# Patient Record
Sex: Male | Born: 1935 | Race: White | Hispanic: No | Smoking: Former smoker
Health system: Southern US, Community
[De-identification: ages and names within clinical notes are randomized; demographics above are authoritative.]

## PROBLEM LIST (undated history)

## (undated) DIAGNOSIS — I10 Essential (primary) hypertension: Secondary | ICD-10-CM

## (undated) DIAGNOSIS — H9190 Unspecified hearing loss, unspecified ear: Secondary | ICD-10-CM

## (undated) DIAGNOSIS — C801 Malignant (primary) neoplasm, unspecified: Secondary | ICD-10-CM

## (undated) DIAGNOSIS — E7800 Pure hypercholesterolemia, unspecified: Secondary | ICD-10-CM

## (undated) HISTORY — PX: COLON RESECTION: SHX5231

## (undated) HISTORY — PX: LUNG SURGERY: SHX703

## (undated) HISTORY — PX: RADIOACTIVE SEED IMPLANT: SHX5150

---

## 1999-09-19 ENCOUNTER — Ambulatory Visit (HOSPITAL_COMMUNITY): Admission: RE | Admit: 1999-09-19 | Discharge: 1999-09-19 | Payer: Self-pay | Admitting: *Deleted

## 1999-12-06 ENCOUNTER — Ambulatory Visit (HOSPITAL_COMMUNITY): Admission: RE | Admit: 1999-12-06 | Discharge: 1999-12-06 | Payer: Self-pay | Admitting: Gastroenterology

## 1999-12-06 ENCOUNTER — Encounter (INDEPENDENT_AMBULATORY_CARE_PROVIDER_SITE_OTHER): Payer: Self-pay | Admitting: Specialist

## 2000-11-27 ENCOUNTER — Encounter (INDEPENDENT_AMBULATORY_CARE_PROVIDER_SITE_OTHER): Payer: Self-pay | Admitting: Specialist

## 2000-11-27 ENCOUNTER — Other Ambulatory Visit: Admission: RE | Admit: 2000-11-27 | Discharge: 2000-11-27 | Payer: Self-pay | Admitting: Urology

## 2000-12-19 ENCOUNTER — Ambulatory Visit: Admission: RE | Admit: 2000-12-19 | Discharge: 2001-03-19 | Payer: Self-pay | Admitting: Radiation Oncology

## 2001-02-23 ENCOUNTER — Ambulatory Visit (HOSPITAL_COMMUNITY): Admission: RE | Admit: 2001-02-23 | Discharge: 2001-02-23 | Payer: Self-pay | Admitting: Urology

## 2001-02-23 ENCOUNTER — Encounter: Payer: Self-pay | Admitting: Urology

## 2001-03-23 ENCOUNTER — Ambulatory Visit (HOSPITAL_BASED_OUTPATIENT_CLINIC_OR_DEPARTMENT_OTHER): Admission: RE | Admit: 2001-03-23 | Discharge: 2001-03-23 | Payer: Self-pay | Admitting: Urology

## 2001-04-13 ENCOUNTER — Ambulatory Visit: Admission: RE | Admit: 2001-04-13 | Discharge: 2001-07-12 | Payer: Self-pay | Admitting: Radiation Oncology

## 2002-12-02 ENCOUNTER — Encounter (INDEPENDENT_AMBULATORY_CARE_PROVIDER_SITE_OTHER): Payer: Self-pay | Admitting: Specialist

## 2002-12-02 ENCOUNTER — Ambulatory Visit (HOSPITAL_COMMUNITY): Admission: RE | Admit: 2002-12-02 | Discharge: 2002-12-02 | Payer: Self-pay | Admitting: Gastroenterology

## 2006-11-12 ENCOUNTER — Encounter (INDEPENDENT_AMBULATORY_CARE_PROVIDER_SITE_OTHER): Payer: Self-pay | Admitting: Internal Medicine

## 2006-11-12 ENCOUNTER — Ambulatory Visit: Payer: Self-pay | Admitting: Vascular Surgery

## 2006-11-12 ENCOUNTER — Ambulatory Visit (HOSPITAL_COMMUNITY): Admission: RE | Admit: 2006-11-12 | Discharge: 2006-11-12 | Payer: Self-pay | Admitting: Internal Medicine

## 2009-10-12 ENCOUNTER — Ambulatory Visit (HOSPITAL_COMMUNITY): Admission: RE | Admit: 2009-10-12 | Discharge: 2009-10-13 | Payer: Self-pay | Admitting: General Surgery

## 2010-07-16 LAB — DIFFERENTIAL
Basophils Absolute: 0.1 10*3/uL (ref 0.0–0.1)
Basophils Relative: 1 % (ref 0–1)
Eosinophils Absolute: 0.2 10*3/uL (ref 0.0–0.7)
Eosinophils Relative: 3 % (ref 0–5)
Lymphocytes Relative: 23 % (ref 12–46)
Lymphs Abs: 1.3 10*3/uL (ref 0.7–4.0)
Monocytes Absolute: 0.4 10*3/uL (ref 0.1–1.0)
Monocytes Relative: 6 % (ref 3–12)
Neutro Abs: 3.6 10*3/uL (ref 1.7–7.7)
Neutrophils Relative %: 67 % (ref 43–77)

## 2010-07-16 LAB — CBC
HCT: 42.4 % (ref 39.0–52.0)
Hemoglobin: 14.7 g/dL (ref 13.0–17.0)
MCHC: 34.6 g/dL (ref 30.0–36.0)
MCV: 99.4 fL (ref 78.0–100.0)
Platelets: 121 10*3/uL — ABNORMAL LOW (ref 150–400)
RBC: 4.27 MIL/uL (ref 4.22–5.81)
RDW: 13 % (ref 11.5–15.5)
WBC: 5.6 10*3/uL (ref 4.0–10.5)

## 2010-07-16 LAB — BASIC METABOLIC PANEL
BUN: 6 mg/dL (ref 6–23)
CO2: 28 mEq/L (ref 19–32)
Calcium: 8.6 mg/dL (ref 8.4–10.5)
Chloride: 106 mEq/L (ref 96–112)
Creatinine, Ser: 1.18 mg/dL (ref 0.4–1.5)
GFR calc Af Amer: >60 mL/min (ref >60)
GFR calc non Af Amer: 59 mL/min — ABNORMAL LOW (ref >60)
Glucose, Bld: 109 mg/dL — ABNORMAL HIGH (ref 70–99)
Potassium: 3.3 mEq/L — ABNORMAL LOW (ref 3.5–5.1)
Sodium: 140 mEq/L (ref 135–145)

## 2010-07-16 LAB — SURGICAL PCR SCREEN
MRSA, PCR: NEGATIVE
Staphylococcus aureus: POSITIVE — AB

## 2010-09-14 NOTE — Op Note (Signed)
[facility]  Patient:    Scott Orozco, Scott Orozco Visit Number: [id number] MRN: [id number]          Service Type: NES Location: [facility] Attending Physician:  Thermon Leyland Dictated by:   Barron Alvine, M.D. Admit Date:  03/23/2001   CC:         Maryln Gottron, M.D.  Tyson Dense, M.D.   Operative Report  PREOPERATIVE DIAGNOSIS:  Clinical stage T1C adenocarcinoma of the prostate.  POSTOPERATIVE DIAGNOSIS:  Clinical stage T1C adenocarcinoma of the prostate.  PROCEDURE PERFORMED:  Interstitial 125 seed implantation and flexible cystoscopy.  SURGEON:  Barron Alvine, M.D.  ASSISTANT:  Maryln Gottron, M.D.  ANESTHESIA:  General.  INDICATIONS:  The patient is a 77 year old male.  He has been diagnosed with clinical stage T1C adenocarcinoma of the prostate.  He had a small prostate nodule in the area of induration, but he had a normal PSA of 3.6.  Biopsies revealed a Gleason 4+3=7 adenocarcinoma of the prostate.  We did not feel that the patient was a good candidate for surgery.  We did feel that given the higher Gleason score, that this would be potentially a more aggressive carcinoma.  For that reason, we felt treatment was reasonable versus observation.  Given the higher Gleason score, we also felt that he would be best served with a combination of external beam and interstitial seed implantation rather than one treatment by itself.  The advantage and disadvantage of this as well as all potential complications of radiation and seed implantation were discussed at some length.  The patient has received his external beam radiation and has had his planning ultrasonography for seed implantation.  He presents now for the procedure.  TECHNIQUE AND FINDINGS:  The patient was brought to the operating room where he had the successful induction of general anesthesia.  He was placed in the moderate lithotomy position, and prepped and draped in the usual  manner.  A Foley catheter was placed with contrast in the balloon to identify the bladder neck.  Positioning was done very carefully to assure similar positioning to his planning ultrasound.  The transrectal ultrasound probe was attached to a base which was firmly secured to the fluoroscopy table.  After careful positioning, we began the implantation after prepping and draping him in the usual manner.  Utilizing a combination of Real Time fluoroscopy as well as Real Time ultrasonography, we went ahead and initiated seed implantation.  Anchor needles were also placed in the prostate for increased stability.  A total of 19 needle passages were performed with a total of 57 seeds.  Of note, the prostate volume was only about 15 g.  Each needle passage was done with Real Time fluoroscopy as well as ultrasound guidance.  The distribution of the seeds at the end of the procedure looked excellent.  The Foley catheter was removed and flexible cystoscopy revealed no seeds within the urethra or bladder.  A repeat Foley catheter was placed in the typical manner.  The patient was then brought to the recovery room in stable condition. Dictated by:   Barron Alvine, M.D. Attending Physician:  Thermon Leyland DD:  03/23/01 TD:  03/23/01 Job: [id number]

## 2010-09-14 NOTE — Procedures (Signed)
[facility]  Patient:    Scott Orozco, Scott Orozco                      MRN: [id number] Proc. Date: 12/06/99 Attending:  Verlin Grills, M.D. CC:         Lum Babe, M.D.   Procedure Report  REFERRING PHYSICIAN:  Lum Babe, M.D.  PROCEDURE PERFORMED:  Colonoscopy and polypectomy.  ENDOSCOPIST:  Verlin Grills, M.D.  INDICATIONS:  Mr. Scott Orozco is a 77 year old male who underwent colon cancer surgery in approximately 1983.  He is due for post colon cancer polyp surveillance.  I discussed with Mr. Scott Orozco the complications associated with colonoscopy and polypectomy including intestinal bleeding and intestinal perforation. Mr. Scott Orozco has signed the operative permit.  PREMEDICATIONS:  Demerol 50 mg and Versed 5 mg.  ENDOSCOPE:  Olympus pediatric colonoscope.  DESCRIPTION OF PROCEDURE:  After obtaining informed consent, the patient was placed in the left lateral decubitus position.  I administered intravenous Demerol and intravenous Versed to achieve conscious sedation for the procedure.  The patients blood pressure, oxygen saturation and cardiac rhythm were monitored throughout the procedure and documented in the medical record.  Anal inspection was normal.  Digital rectal exam revealed an absent prostate. The Olympus pediatric video colonoscope was introduced into the rectum and under direct vision advanced to the cecum identified by a normal appearing ileocecal valve.  Colonic preparation for the exam today was excellent.  FINDINGS: 1. Rectum normal. 2. Sigmoid colon and descending colon:  At 40 cm from the anal verge,    a 1 mm sessile polyp was removed with hot biopsy forceps and    submitted for pathological interpretation. 3. Splenic flexure normal. 4. Transverse colon normal. 5. Hepatic flexure. 6. Ascending colon:  From the ascending colon, two 0.5 mm sessile    polyps were removed with cold biopsy forceps. 7.  Cecum and ileocecal valve normal.  ASSESSMENT: 1. Post colon cancer surgery. 2. From the right colon two 0.5 mg sessile polyps removed with the    colon biopsy forceps. 3. From the sigmoid colon at 40 cm from the anal verge, a 1 mm    sessile polyp was removed.  This polyp was removed with the hot    biopsy forceps. DD:  12/06/99 TD:  12/07/99 Job: [id number]

## 2010-09-14 NOTE — Op Note (Signed)
NAMEGINA, COSTILLA                         ACCOUNT NO.:  [id number]   MEDICAL RECORD NO.:  [id number]                   PATIENT TYPE:  AMB   LOCATION:  ENDO                                 FACILITY:  [facility]   PHYSICIAN:  Danise Edge, M.D.                DATE OF BIRTH:  11/26/1927   DATE OF PROCEDURE:  12/02/2002  DATE OF DISCHARGE:                                 OPERATIVE REPORT   PROCEDURE PERFORMED:  Colonoscopy.   ENDOSCOPIST:  Charolett Bumpers, M.D.   INDICATIONS FOR PROCEDURE:  Mr. Jonathin Heinicke is a 77 year old male born  September 28, 1927.  In 77 Mr. Creg underwent a segmental sigmoid colon  resection to remove adenocarcinoma of the colon. He is scheduled to undergo  a surveillance colonoscopy with polypectomy to prevent recurrent colon  cancer.   PREMEDICATION:  Versed 5 mg, Demerol 50 mg.   DESCRIPTION OF PROCEDURE:  After obtaining informed consent, Mr. Tarte was  placed in the left lateral decubitus position.  I administered intravenous  Demerol and intravenous Versed to achieve conscious sedation for the  procedure.  The patient's blood pressure, oxygen saturations and cardiac  rhythm were monitored throughout the procedure and documented in the medical  record.   Anal inspection was normal.  Digital rectal exam was normal.  The prostate  was absent.  The Olympus adult colonoscope was introduced into the rectum  and easily advanced to the cecum.  Colonic preparation for the exam today  was excellent.   Rectum:  Normal.   Sigmoid colon and descending colon:  Normal.  A surgical anastomosis is  patent.   Splenic flexure:  Normal.   Transverse colon:  From the midtransverse colon, a 1mm sessile polyp was  removed with the cold snare but no pathology retrieved.   Hepatic flexure:  Normal.   Ascending colon:  Normal.   Cecum and ileocecal valve:  From the proximal cecum, a 2 mm sessile polyp  was removed with the electrocautery snare and  submitted for pathological  interpretation.   ASSESSMENT:  1. Segmental sigmoid colon resection for colon cancer in 1981.  2. From the proximal cecum, a 2mm sessile polyp was removed and submitted     for pathological interpretation.  3.     From the midtransverse colon, a 1 mm sessile polyp was removed was removed     with the cold snare and no pathology retrieved.   RECOMMENDATIONS:  Repeat colonoscopy in five years.                                               Danise Edge, M.D.    MJ/MEDQ  D:  12/02/2002  T:  12/02/2002  Job:  [id number]   cc:   Georgann Housekeeper, M.D.  [phone]  [address]  [place]  [place] [address]  Fax: [phone]

## 2011-06-27 DIAGNOSIS — E782 Mixed hyperlipidemia: Secondary | ICD-10-CM | POA: Diagnosis not present

## 2011-06-27 DIAGNOSIS — C61 Malignant neoplasm of prostate: Secondary | ICD-10-CM | POA: Diagnosis not present

## 2011-06-27 DIAGNOSIS — M1990 Unspecified osteoarthritis, unspecified site: Secondary | ICD-10-CM | POA: Diagnosis not present

## 2011-06-27 DIAGNOSIS — I10 Essential (primary) hypertension: Secondary | ICD-10-CM | POA: Diagnosis not present

## 2011-06-27 DIAGNOSIS — C189 Malignant neoplasm of colon, unspecified: Secondary | ICD-10-CM | POA: Diagnosis not present

## 2012-01-01 DIAGNOSIS — Z1331 Encounter for screening for depression: Secondary | ICD-10-CM | POA: Diagnosis not present

## 2012-01-01 DIAGNOSIS — Z0000 Encounter for general adult medical examination without abnormal findings: Secondary | ICD-10-CM | POA: Diagnosis not present

## 2012-01-01 DIAGNOSIS — C189 Malignant neoplasm of colon, unspecified: Secondary | ICD-10-CM | POA: Diagnosis not present

## 2012-01-01 DIAGNOSIS — I10 Essential (primary) hypertension: Secondary | ICD-10-CM | POA: Diagnosis not present

## 2012-01-01 DIAGNOSIS — E782 Mixed hyperlipidemia: Secondary | ICD-10-CM | POA: Diagnosis not present

## 2012-01-01 DIAGNOSIS — Z8546 Personal history of malignant neoplasm of prostate: Secondary | ICD-10-CM | POA: Diagnosis not present

## 2012-01-01 DIAGNOSIS — M1990 Unspecified osteoarthritis, unspecified site: Secondary | ICD-10-CM | POA: Diagnosis not present

## 2012-07-01 DIAGNOSIS — M1990 Unspecified osteoarthritis, unspecified site: Secondary | ICD-10-CM | POA: Diagnosis not present

## 2012-07-01 DIAGNOSIS — E782 Mixed hyperlipidemia: Secondary | ICD-10-CM | POA: Diagnosis not present

## 2012-07-01 DIAGNOSIS — C61 Malignant neoplasm of prostate: Secondary | ICD-10-CM | POA: Diagnosis not present

## 2012-07-01 DIAGNOSIS — I10 Essential (primary) hypertension: Secondary | ICD-10-CM | POA: Diagnosis not present

## 2012-07-01 DIAGNOSIS — C189 Malignant neoplasm of colon, unspecified: Secondary | ICD-10-CM | POA: Diagnosis not present

## 2012-10-14 DIAGNOSIS — Z1211 Encounter for screening for malignant neoplasm of colon: Secondary | ICD-10-CM | POA: Diagnosis not present

## 2012-12-13 ENCOUNTER — Emergency Department (HOSPITAL_COMMUNITY): Payer: Medicare Other

## 2012-12-13 ENCOUNTER — Emergency Department (HOSPITAL_COMMUNITY)
Admission: EM | Admit: 2012-12-13 | Discharge: 2012-12-13 | Disposition: A | Discharge status: Home / Self Care | Payer: Medicare Other | Attending: Emergency Medicine | Admitting: Emergency Medicine

## 2012-12-13 ENCOUNTER — Encounter (HOSPITAL_COMMUNITY): Payer: Self-pay

## 2012-12-13 DIAGNOSIS — Z87891 Personal history of nicotine dependence: Secondary | ICD-10-CM | POA: Diagnosis not present

## 2012-12-13 DIAGNOSIS — R1011 Right upper quadrant pain: Secondary | ICD-10-CM | POA: Diagnosis not present

## 2012-12-13 DIAGNOSIS — K4090 Unilateral inguinal hernia, without obstruction or gangrene, not specified as recurrent: Secondary | ICD-10-CM | POA: Diagnosis not present

## 2012-12-13 DIAGNOSIS — Z7982 Long term (current) use of aspirin: Secondary | ICD-10-CM | POA: Insufficient documentation

## 2012-12-13 DIAGNOSIS — R1110 Vomiting, unspecified: Secondary | ICD-10-CM

## 2012-12-13 DIAGNOSIS — E7800 Pure hypercholesterolemia, unspecified: Secondary | ICD-10-CM | POA: Diagnosis not present

## 2012-12-13 DIAGNOSIS — Z8669 Personal history of other diseases of the nervous system and sense organs: Secondary | ICD-10-CM | POA: Diagnosis not present

## 2012-12-13 DIAGNOSIS — I10 Essential (primary) hypertension: Secondary | ICD-10-CM | POA: Diagnosis not present

## 2012-12-13 DIAGNOSIS — R112 Nausea with vomiting, unspecified: Secondary | ICD-10-CM | POA: Diagnosis not present

## 2012-12-13 DIAGNOSIS — K824 Cholesterolosis of gallbladder: Secondary | ICD-10-CM | POA: Diagnosis not present

## 2012-12-13 DIAGNOSIS — R1013 Epigastric pain: Secondary | ICD-10-CM | POA: Diagnosis not present

## 2012-12-13 DIAGNOSIS — Z79899 Other long term (current) drug therapy: Secondary | ICD-10-CM | POA: Diagnosis not present

## 2012-12-13 DIAGNOSIS — Z85038 Personal history of other malignant neoplasm of large intestine: Secondary | ICD-10-CM | POA: Diagnosis not present

## 2012-12-13 DIAGNOSIS — R10819 Abdominal tenderness, unspecified site: Secondary | ICD-10-CM | POA: Diagnosis not present

## 2012-12-13 DIAGNOSIS — R109 Unspecified abdominal pain: Secondary | ICD-10-CM | POA: Diagnosis not present

## 2012-12-13 DIAGNOSIS — K2970 Gastritis, unspecified, without bleeding: Secondary | ICD-10-CM | POA: Diagnosis not present

## 2012-12-13 DIAGNOSIS — N289 Disorder of kidney and ureter, unspecified: Secondary | ICD-10-CM | POA: Diagnosis not present

## 2012-12-13 HISTORY — DX: Unspecified hearing loss, unspecified ear: H91.90

## 2012-12-13 HISTORY — DX: Essential (primary) hypertension: I10

## 2012-12-13 HISTORY — DX: Pure hypercholesterolemia, unspecified: E78.00

## 2012-12-13 HISTORY — DX: Malignant (primary) neoplasm, unspecified: C80.1

## 2012-12-13 LAB — URINALYSIS, ROUTINE W REFLEX MICROSCOPIC
Leukocytes, UA: NEGATIVE
Protein, ur: NEGATIVE mg/dL
Specific Gravity, Urine: 1.024 (ref 1.005–1.030)
Urobilinogen, UA: 0.2 mg/dL (ref 0.0–1.0)

## 2012-12-13 LAB — CBC WITH DIFFERENTIAL/PLATELET
Basophils Absolute: 0 10*3/uL (ref 0.0–0.1)
Eosinophils Absolute: 0 10*3/uL (ref 0.0–0.7)
Eosinophils Relative: 1 % (ref 0–5)
HCT: 43.5 % (ref 39.0–52.0)
Lymphocytes Relative: 9 % — ABNORMAL LOW (ref 12–46)
MCH: 33.2 pg (ref 26.0–34.0)
MCV: 94.4 fL (ref 78.0–100.0)
Monocytes Absolute: 0.3 10*3/uL (ref 0.1–1.0)
Platelets: 102 10*3/uL — ABNORMAL LOW (ref 150–400)
RDW: 12.5 % (ref 11.5–15.5)
WBC: 6 10*3/uL (ref 4.0–10.5)

## 2012-12-13 LAB — COMPREHENSIVE METABOLIC PANEL
AST: 13 U/L (ref 0–37)
CO2: 23 mEq/L (ref 19–32)
Calcium: 9 mg/dL (ref 8.4–10.5)
Creatinine, Ser: 1.12 mg/dL (ref 0.50–1.35)
GFR calc Af Amer: 68 mL/min — ABNORMAL LOW (ref >90)
GFR calc non Af Amer: 59 mL/min — ABNORMAL LOW (ref >90)
Glucose, Bld: 152 mg/dL — ABNORMAL HIGH (ref 70–99)
Sodium: 134 mEq/L — ABNORMAL LOW (ref 135–145)
Total Protein: 6.1 g/dL (ref 6.0–8.3)

## 2012-12-13 MED ORDER — MORPHINE SULFATE 4 MG/ML IJ SOLN
4.0000 mg | Freq: Once | INTRAMUSCULAR | Status: AC
  Administered 2012-12-13: 4 mg via INTRAVENOUS
  Filled 2012-12-13: qty 1

## 2012-12-13 MED ORDER — POTASSIUM CHLORIDE CRYS ER 20 MEQ PO TBCR
20.0000 meq | EXTENDED_RELEASE_TABLET | Freq: Once | ORAL | Status: AC
  Administered 2012-12-13: 20 meq via ORAL
  Filled 2012-12-13: qty 1

## 2012-12-13 MED ORDER — IOHEXOL 300 MG/ML  SOLN
50.0000 mL | Freq: Once | INTRAMUSCULAR | Status: AC | PRN
  Administered 2012-12-13: 50 mL via ORAL

## 2012-12-13 MED ORDER — ONDANSETRON HCL 4 MG/2ML IJ SOLN
4.0000 mg | Freq: Once | INTRAMUSCULAR | Status: AC
  Administered 2012-12-13: 4 mg via INTRAVENOUS
  Filled 2012-12-13: qty 2

## 2012-12-13 MED ORDER — OXYCODONE-ACETAMINOPHEN 5-325 MG PO TABS: 1.0000 | ORAL_TABLET | Freq: Four times a day (QID) | ORAL | Status: AC | PRN

## 2012-12-13 MED ORDER — ONDANSETRON 4 MG PO TBDP: ORAL_TABLET | ORAL | Status: AC

## 2012-12-13 MED ORDER — SODIUM CHLORIDE 0.9 % IV BOLUS (SEPSIS)
1000.0000 mL | Freq: Once | INTRAVENOUS | Status: AC
  Administered 2012-12-13: 1000 mL via INTRAVENOUS

## 2012-12-13 MED ORDER — IOHEXOL 300 MG/ML  SOLN
100.0000 mL | Freq: Once | INTRAMUSCULAR | Status: AC | PRN
  Administered 2012-12-13: 100 mL via INTRAVENOUS

## 2012-12-13 NOTE — ED Notes (Signed)
Pt attempted to call ride again x2 with no answer.

## 2012-12-13 NOTE — ED Notes (Signed)
Bed: WA21 Expected date:  Expected time:  Means of arrival:  Comments: EMS  

## 2012-12-13 NOTE — ED Notes (Signed)
Called pts ride again with no answer.  Calling a cab per pt request.

## 2012-12-13 NOTE — ED Notes (Signed)
Pt called someone to get a ride home.  There was no answer and pt believes she is in church.  Will attempt to call again in a little while.

## 2012-12-13 NOTE — ED Provider Notes (Addendum)
Care assumed from sign out from Dr. Norlene Campbell. Patient is a 79 yo M hx of colon ca s/p resection now in remission here with abdominal pain. Sign out to follow up RUQ [place] and reassess patient. Patient felt better. RUQ US showed no gallstones or AAA. Patient's abdomen still has minimal periumbilical tenderness. Given previous surgery, partial SBO is still possible and early appy is on the differential. I offered him CT ab/pel vs observation at home and he would rather go home. However, prior to discharge, he had more pain so will get CT ab/pel now.   9:40 AM CT showed possible gallbladder polyp, otherwise nl appendix and no SBO. Likely gastro vs pain from scar tissues. He was able to tolerate PO in the ED and I will send him home on some zofran and several percocets for pain. He should get outpatient MRI to assess gallbladder polyp.   Scott Canal, MD 12/13/12 1610  Scott Canal, MD 12/13/12 [phone]

## 2012-12-13 NOTE — ED Notes (Signed)
Patient transported to CT 

## 2012-12-13 NOTE — ED Provider Notes (Signed)
CSN: [id number]     Arrival date & time 12/13/12  0351 History     First MD Initiated Contact with Patient 12/13/12 0403     Chief Complaint  Patient presents with  . Abdominal Pain   (Consider location/radiation/quality/duration/timing/severity/associated sxs/prior Treatment) HPI 79 yo male presents to the ER with complaint of abdominal pain starting about 3 hours ago.  Pt reports he awoke with the pain, but is unsure if the pain woke him or if he just needed to urinate.  Pain is at umbilicus and RUQ with some radiation into his back.  He has had n/v with the pain.  Normal BM yesterday.  Pt reports similar sxs about 4 months ago, attributed it to "stomach bug".  No fevers, no sick contacts, no unusual foods.  Pt reports remote history of surgery to abd x 2 for colon cancer and prostate cancer.  No h/o sbo.  Pt took pepto and gas pills without improvement in symptoms.  Past Medical History  Diagnosis Date  . Hypertension   . Hypercholesteremia   . Hard of hearing   . Cancer     colon   Past Surgical History  Procedure Laterality Date  . Lung surgery    . Radioactive seed implant    . Colon resection     History reviewed. No pertinent family history. History  Substance Use Topics  . Smoking status: Former Games developer  . Smokeless tobacco: Not on file  . Alcohol Use: No    Review of Systems  All other systems reviewed and are negative.    Allergies  Lipitor  Home Medications   Current Outpatient Rx  Name  Route  Sig  Dispense  Refill  . aspirin EC 81 MG tablet   Oral   Take 81 mg by mouth every morning.         Marland Kitchen doxazosin (CARDURA) 4 MG tablet   Oral   Take 4 mg by mouth at bedtime.         . enalapril (VASOTEC) 10 MG tablet   Oral   Take 10 mg by mouth 2 (two) times daily.         . furosemide (LASIX) 40 MG tablet   Oral   Take 40 mg by mouth every morning.         . metoprolol (LOPRESSOR) 50 MG tablet   Oral   Take 50 mg by mouth 2 (two) times  daily.         . Multiple Vitamin (MULTIVITAMIN WITH MINERALS) TABS tablet   Oral   Take 1 tablet by mouth every morning.         Marland Kitchen OVER THE COUNTER MEDICATION   Oral   Take 1 tablet by mouth 2 (two) times daily as needed (pain). OTC pain reliever         . PRESCRIPTION MEDICATION   Oral   Take 1 tablet by mouth every evening.          BP 166/47  Pulse 50  Resp 20  SpO2 99% Physical Exam  Nursing note and vitals reviewed. Constitutional: He is oriented to person, place, and time. He appears well-developed and well-nourished.  HENT:  Head: Normocephalic and atraumatic.  Nose: Nose normal.  Mouth/Throat: Oropharynx is clear and moist.  Eyes: Conjunctivae and EOM are normal. Pupils are equal, round, and reactive to light.  Neck: Normal range of motion. Neck supple. No JVD present. No tracheal deviation present. No thyromegaly present.  Cardiovascular:  Normal rate, regular rhythm, normal heart sounds and intact distal pulses.  Exam reveals no gallop and no friction rub.   No murmur heard. Pulmonary/Chest: Effort normal and breath sounds normal. No stridor. No respiratory distress. He has no wheezes. He has no rales. He exhibits no tenderness.  Abdominal: Soft. Bowel sounds are normal. He exhibits no distension and no mass. There is tenderness (TTP over RUQ, epigstrium, and slightly at umbilicus). There is no rebound and no guarding.  Musculoskeletal: Normal range of motion. He exhibits no edema and no tenderness.  Lymphadenopathy:    He has no cervical adenopathy.  Neurological: He is alert and oriented to person, place, and time. He exhibits normal muscle tone. Coordination normal.  Skin: Skin is warm and dry. No rash noted. No erythema. No pallor.  Psychiatric: He has a normal mood and affect. His behavior is normal. Judgment and thought content normal.    ED Course   Procedures (including critical care time)  Labs Reviewed  CBC WITH DIFFERENTIAL - Abnormal; Notable  for the following:    Platelets 102 (*)    Neutrophils Relative % 85 (*)    Lymphocytes Relative 9 (*)    Lymphs Abs 0.6 (*)    All other components within normal limits  COMPREHENSIVE METABOLIC PANEL - Abnormal; Notable for the following:    Sodium 134 (*)    Potassium 3.3 (*)    Glucose, Bld 152 (*)    GFR calc non Af Amer 59 (*)    GFR calc Af Amer 68 (*)    All other components within normal limits  LIPASE, BLOOD  URINALYSIS, ROUTINE W REFLEX MICROSCOPIC   No results found. No diagnosis found.  MDM  79 yo male with abdominal pain.  Pain seems to be mainly in RUQ, with some milder pain at umbilicus.  No further vomiting.  Similar sxs about 4 months ago.  Will check labs, plan for U/S of abd for possible gallstones.  DDx also includes SBO, early appendicitis, diverticulitis.  Will treat for pain, nausea.  7:10 AM Care passed to DR Silverio Lay awaiting u/s results.  Olivia Mackie, MD 12/13/12 [phone]

## 2012-12-13 NOTE — ED Notes (Signed)
Per EMS, pt from home.  Pt c/o abdominal and back pain starting 2-3 hours ago.  Also with n/v x 3.  No diarrhea, no fever.  Vitals:  145/109, hr 62, resp 16, 98%ra.  cbg 148 8/10 pain, hx: htn, hypercholesteremia

## 2013-01-05 DIAGNOSIS — E782 Mixed hyperlipidemia: Secondary | ICD-10-CM | POA: Diagnosis not present

## 2013-01-05 DIAGNOSIS — R7989 Other specified abnormal findings of blood chemistry: Secondary | ICD-10-CM | POA: Diagnosis not present

## 2013-01-05 DIAGNOSIS — I10 Essential (primary) hypertension: Secondary | ICD-10-CM | POA: Diagnosis not present

## 2013-01-05 DIAGNOSIS — Z0000 Encounter for general adult medical examination without abnormal findings: Secondary | ICD-10-CM | POA: Diagnosis not present

## 2013-01-05 DIAGNOSIS — C189 Malignant neoplasm of colon, unspecified: Secondary | ICD-10-CM | POA: Diagnosis not present

## 2013-01-05 DIAGNOSIS — Z1331 Encounter for screening for depression: Secondary | ICD-10-CM | POA: Diagnosis not present

## 2013-01-05 DIAGNOSIS — Z8546 Personal history of malignant neoplasm of prostate: Secondary | ICD-10-CM | POA: Diagnosis not present

## 2013-03-01 DIAGNOSIS — Z23 Encounter for immunization: Secondary | ICD-10-CM | POA: Diagnosis not present

## 2013-07-07 DIAGNOSIS — K219 Gastro-esophageal reflux disease without esophagitis: Secondary | ICD-10-CM | POA: Diagnosis not present

## 2013-07-07 DIAGNOSIS — N183 Chronic kidney disease, stage 3 unspecified: Secondary | ICD-10-CM | POA: Diagnosis not present

## 2013-07-07 DIAGNOSIS — I10 Essential (primary) hypertension: Secondary | ICD-10-CM | POA: Diagnosis not present

## 2013-07-07 DIAGNOSIS — E782 Mixed hyperlipidemia: Secondary | ICD-10-CM | POA: Diagnosis not present

## 2014-01-06 DIAGNOSIS — R7309 Other abnormal glucose: Secondary | ICD-10-CM | POA: Diagnosis not present

## 2014-01-06 DIAGNOSIS — Z0000 Encounter for general adult medical examination without abnormal findings: Secondary | ICD-10-CM | POA: Diagnosis not present

## 2014-01-06 DIAGNOSIS — N183 Chronic kidney disease, stage 3 unspecified: Secondary | ICD-10-CM | POA: Diagnosis not present

## 2014-01-06 DIAGNOSIS — C189 Malignant neoplasm of colon, unspecified: Secondary | ICD-10-CM | POA: Diagnosis not present

## 2014-01-06 DIAGNOSIS — I10 Essential (primary) hypertension: Secondary | ICD-10-CM | POA: Diagnosis not present

## 2014-01-06 DIAGNOSIS — Z8546 Personal history of malignant neoplasm of prostate: Secondary | ICD-10-CM | POA: Diagnosis not present

## 2014-01-06 DIAGNOSIS — Z1331 Encounter for screening for depression: Secondary | ICD-10-CM | POA: Diagnosis not present

## 2014-01-06 DIAGNOSIS — E782 Mixed hyperlipidemia: Secondary | ICD-10-CM | POA: Diagnosis not present

## 2014-01-06 DIAGNOSIS — Z23 Encounter for immunization: Secondary | ICD-10-CM | POA: Diagnosis not present

## 2014-01-06 DIAGNOSIS — K219 Gastro-esophageal reflux disease without esophagitis: Secondary | ICD-10-CM | POA: Diagnosis not present

## 2014-01-06 DIAGNOSIS — D696 Thrombocytopenia, unspecified: Secondary | ICD-10-CM | POA: Diagnosis not present

## 2014-04-17 IMAGING — CT CT ABD-PELV W/ CM
1 of 4 series · 12 of 32 positions shown, 17 images · IV contrast (omnipaque)
Comparison: Ultrasound same day..

CLINICAL DATA: Right upper quadrant pain, possible appendicitis or
small bowel obstruction history of colon and prostate cancer.

CT ABDOMEN AND PELVIS WITH CONTRAST
TECHNIQUE: Multidetector CT imaging of the abdomen and pelvis was
performed following the standard protocol during bolus
administration of intravenous contrast.
Contrast: 50mL OMNIPAQUE IOHEXOL 300 MG/ML  SOLN, 100mL OMNIPAQUE
IOHEXOL 300 MG/ML  SOLN

[Series 2: abd/pel with · axial · 0.79mm/px · z∈[-496,-81]mm · 12 of 95 slices shown, 17 images]
[im 6/95  soft-tissue]
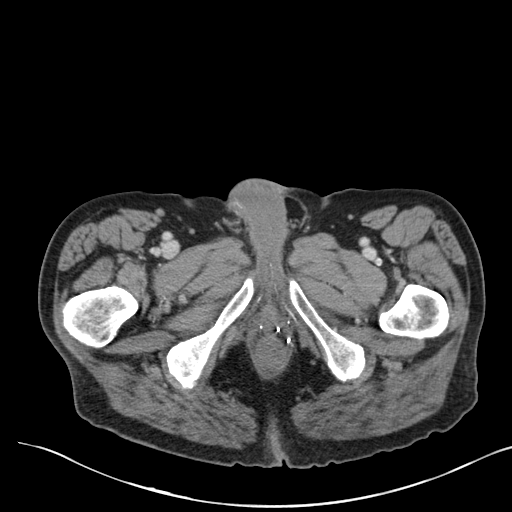
[im 6/95  bone]
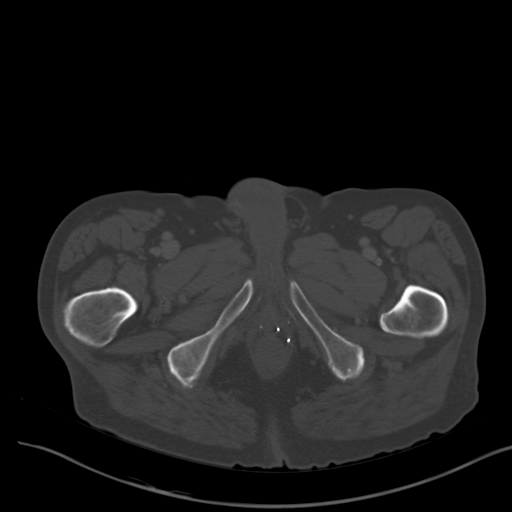
[im 17/95  soft-tissue]
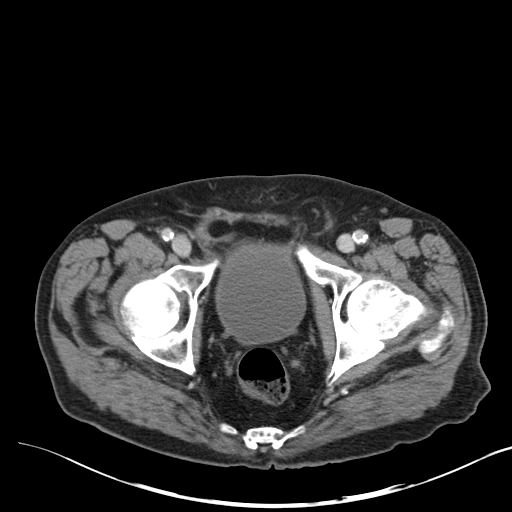
[im 23/95  soft-tissue]
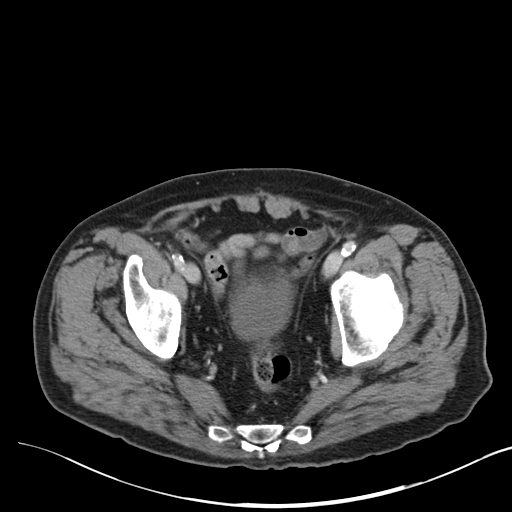
[im 34/95  soft-tissue]
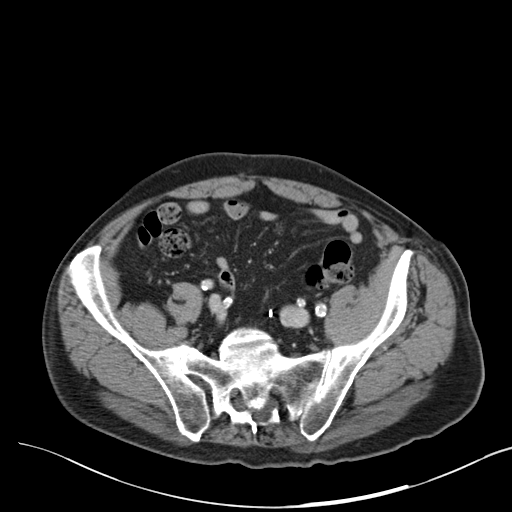
[im 39/95  soft-tissue]
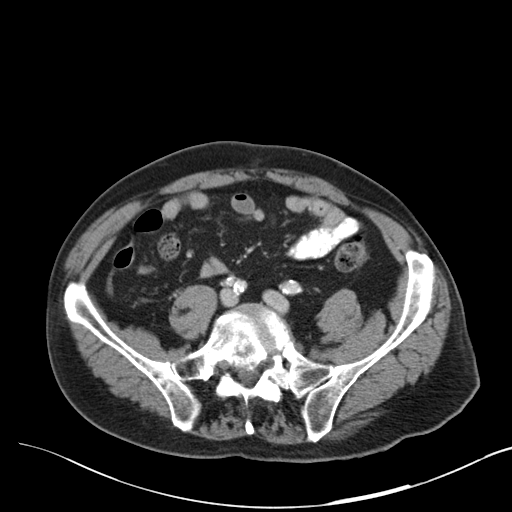
[im 50/95  soft-tissue]
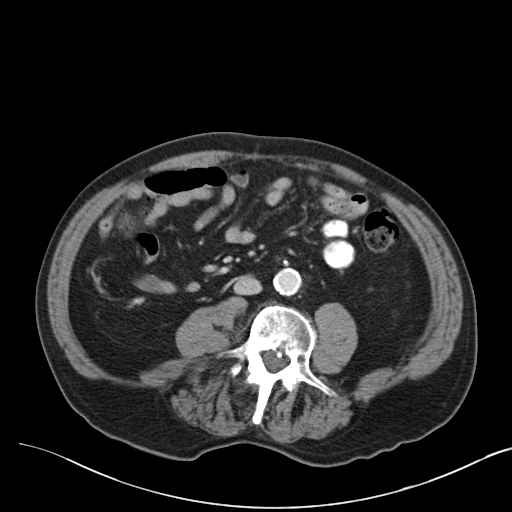
[im 56/95  soft-tissue]
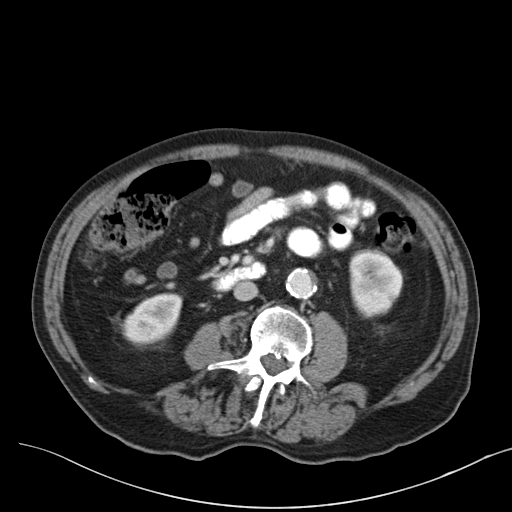
[im 61/95  soft-tissue]
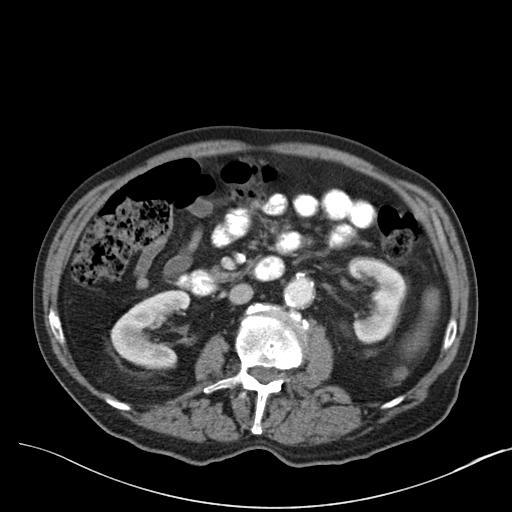
[im 72/95  soft-tissue]
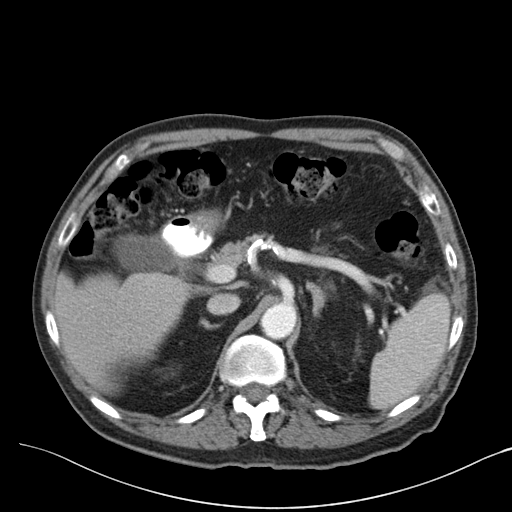
[im 72/95  lung]
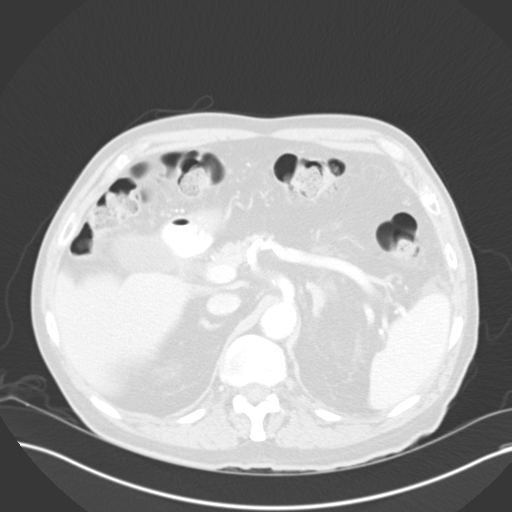
[im 72/95  bone]
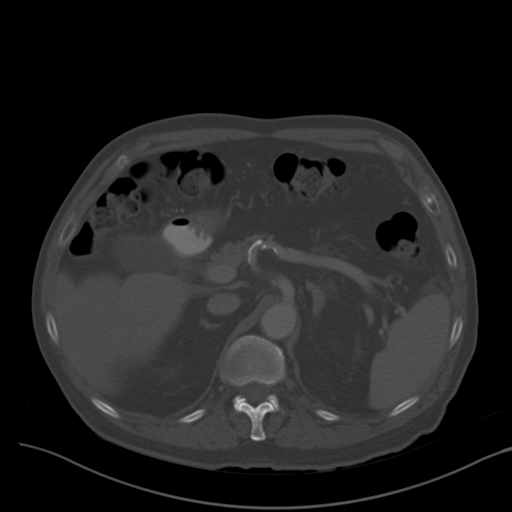
[im 78/95  soft-tissue]
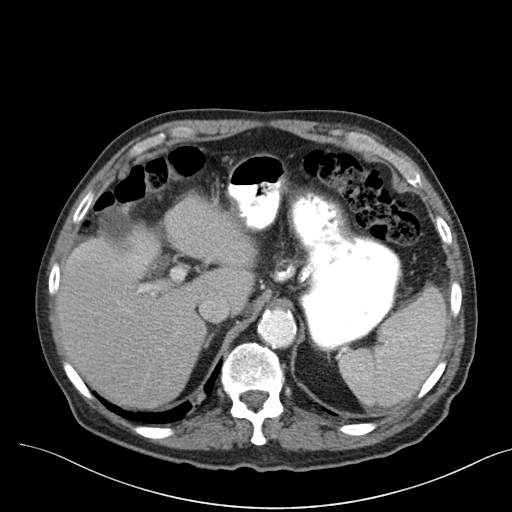
[im 78/95  lung]
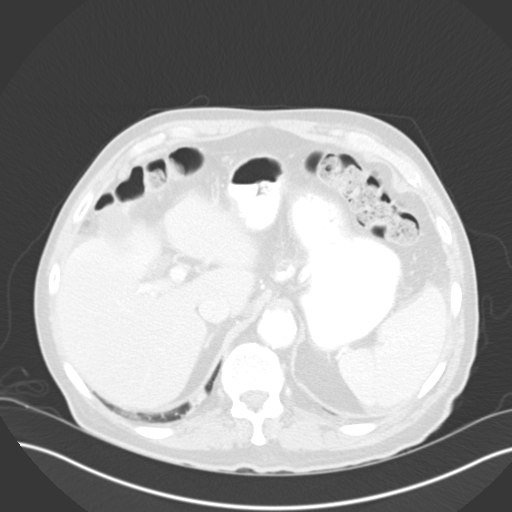
[im 83/95  lung]
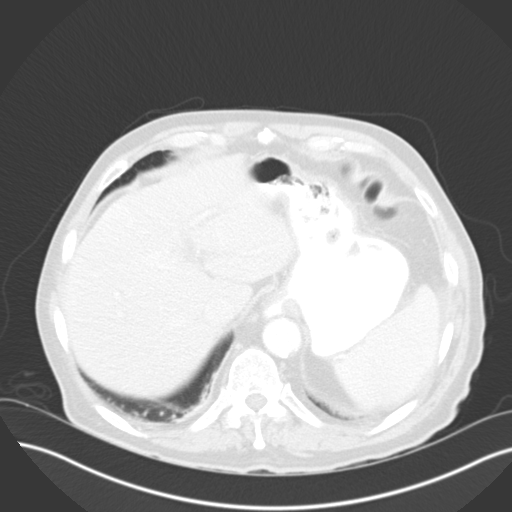
[im 89/95  soft-tissue]
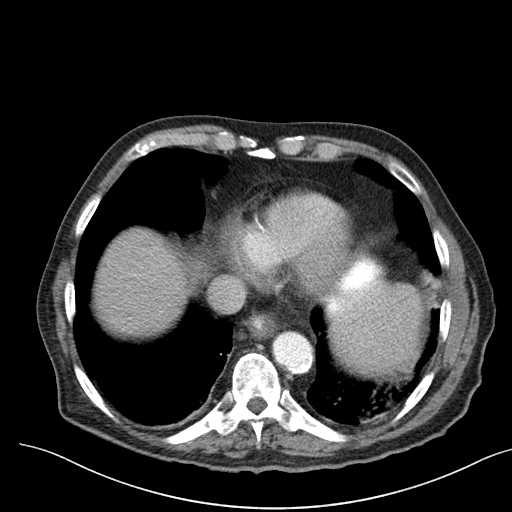
[im 89/95  lung]
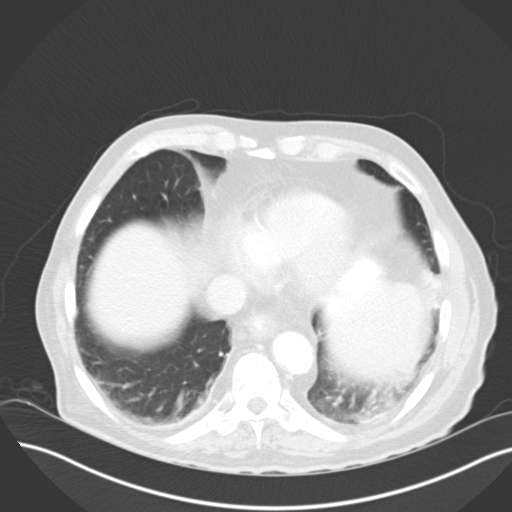

[12 of 32 positions shown; findings below may reference images not displayed]

FINDINGS: Small hiatal hernia is noted.  Sagittal images of the
spine shows multilevel degenerative changes lumbar spine.
Multilevel disc space flattening anterior spurring and endplate
sclerotic changes.

Extensive atherosclerotic calcifications abdominal aorta, splenic
artery and the iliac arteries.  Atherosclerotic calcifications
noted bilateral renal artery origin.  SMA calcifications are noted.
There is a transitional vertebra S1 level.  There is probable
chronic pars defect left-sided S1 level best seen in axial image
58.  There is moderate narrowing of the left neural foramina at the
S1 level.

Levoscoliosis of the lumbar spine is noted.  Enhanced liver is
unremarkable.  No calcified gallstones are noted within
gallbladder.  There is probable gallbladder wall polyp in the axial
image 21 measures about 7 mm. There is a subtle enhancement
therefore a neoplastic process cannot be excluded. Further
evaluation with MRI could be performed. This was not identified on
the gallbladder ultrasound same date.

The pancreas, spleen and adrenal glands are unremarkable.  Small
amount of perisplenic ascites.  CBD measures 9 mm in diameter.

No aortic aneurysm.

No small bowel obstruction.

Normal appendix is partially visualized in coronal image 50.  No
pericecal inflammation.

Kidneys are symmetrical in size and enhancement.  There is a cyst
in the mid pole of the right kidney anteriorly measures 2.3 cm.  A
cyst in the upper pole of the right kidney measures 9 mm.

Radiation seeds are noted in the prostate gland region.  The
urinary bladder is unremarkable.

Postsurgical changes are noted in the distal sigmoid colon.  No
distal colonic obstruction.  There is a left inguinal scrotal canal
hernia containing fat without evidence of acute complication
measures about 3 cm.

Delayed renal images shows bilateral renal symmetrical excretion.
Bilateral visualized proximal ureter is unremarkable.  Moderate
stool noted throughout the colon. No destructive bony lesions are
noted within pelvis.  Degenerative changes bilateral SI joints.
IMPRESSION: 1.  There is a probable gallbladder wall polyp with subtle
enhancement measures about 7.5 mm.  A neoplastic process cannot be
excluded.  Further evaluation with non emergent MRI is recommended.
This was not identified on recent gallbladder ultrasound.

2.  No intrahepatic biliary ductal dilatation.
3.  No calcified gallstones are noted within gallbladder.  No
pericholecystic fluid.
4.  Extensive atherosclerotic vascular calcifications.
5.  No pericecal inflammation.  Normal appendix partially
visualized.
6.  Moderate stool throughout the colon.
7.  Postsurgical changes distal sigmoid colon. There is a left
inguinal scrotal canal hernia containing fat without evidence of
acute complication.
8.  No hydronephrosis or hydroureter.

## 2014-04-17 IMAGING — US US ABDOMEN COMPLETE
1 series · 13 of 25 positions shown · non-contrast
Comparison: None.

CLINICAL DATA: Epigastric and right upper quadrant pain. Nausea and
vomiting.

EXAM:
ABDOMEN ULTRASOUND

[Series 1: us abdomen complete · 0.33mm/px · 13 of 87 slices shown]
[im 1/87]
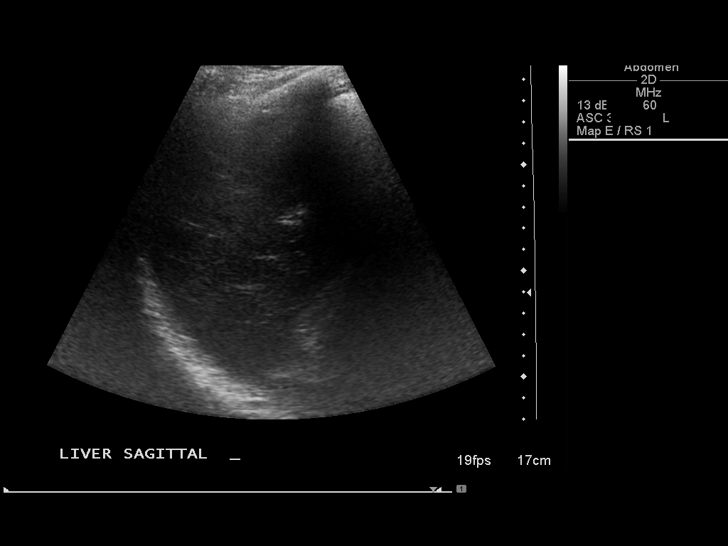
[im 8/87]
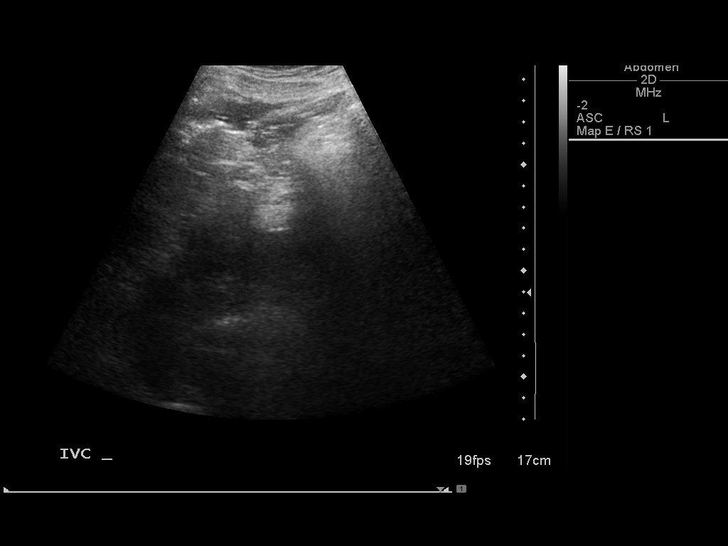
[im 15/87]
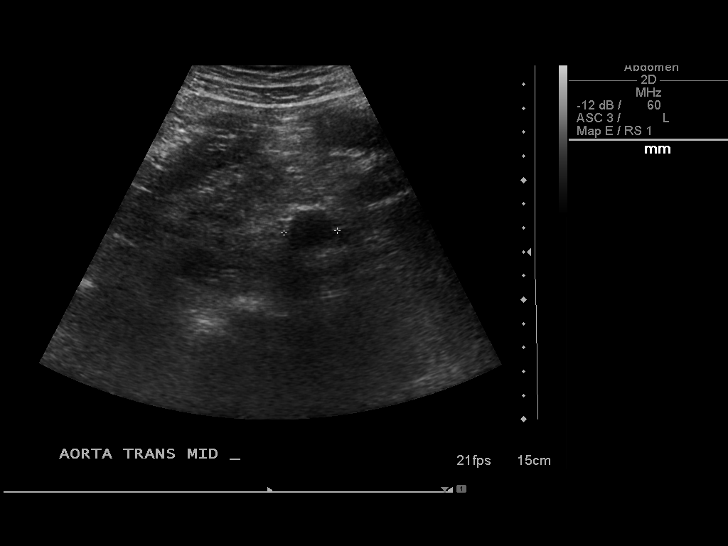
[im 22/87]
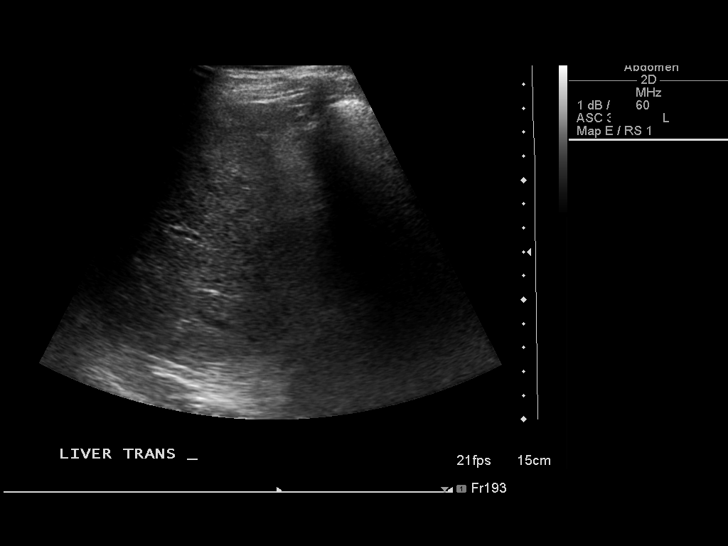
[im 29/87]
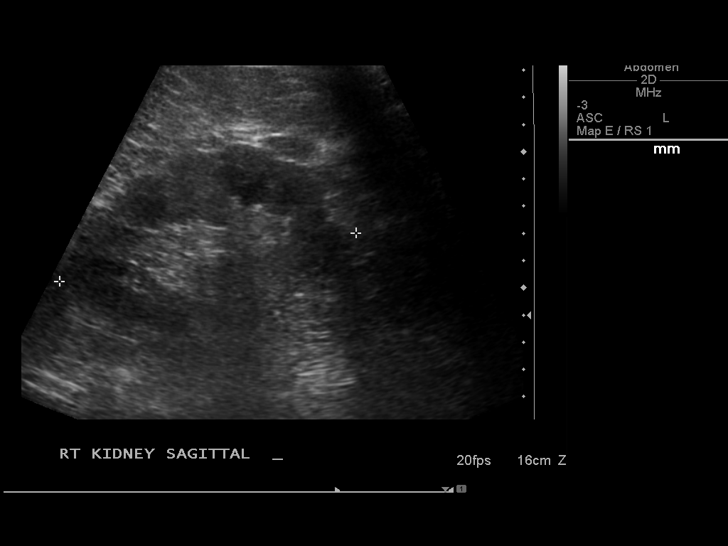
[im 36/87]
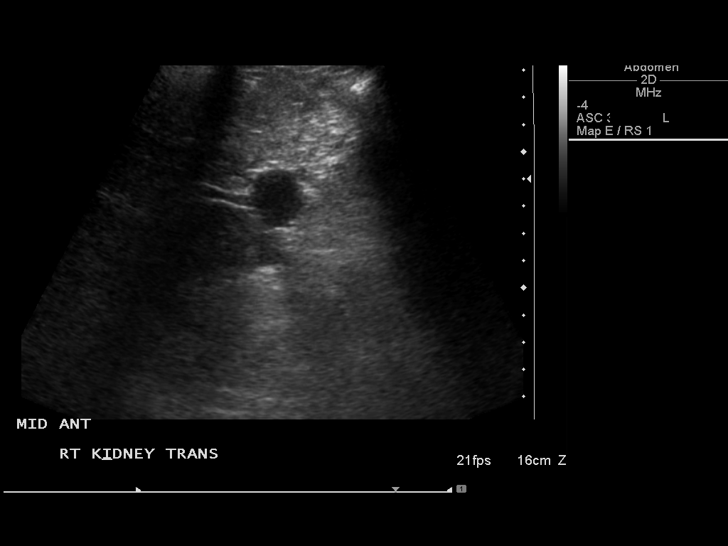
[im 44/87]
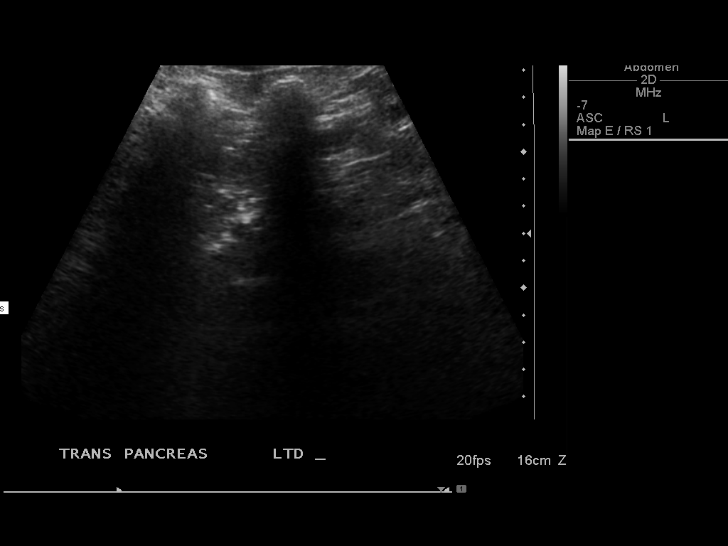
[im 51/87]
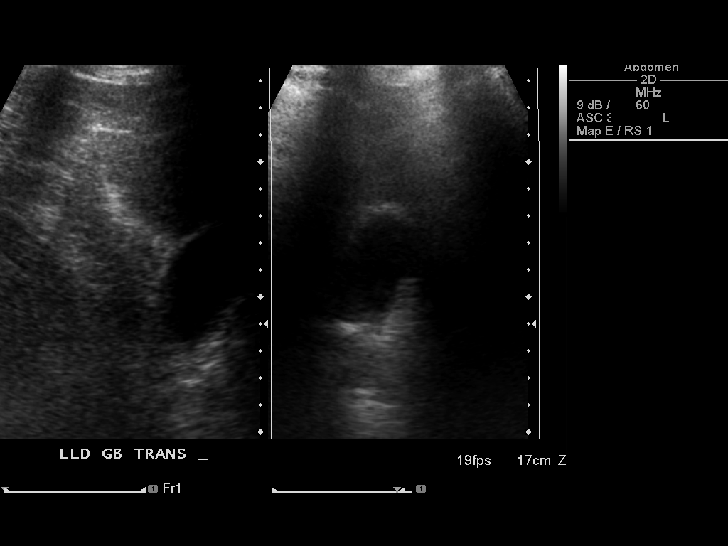
[im 58/87]
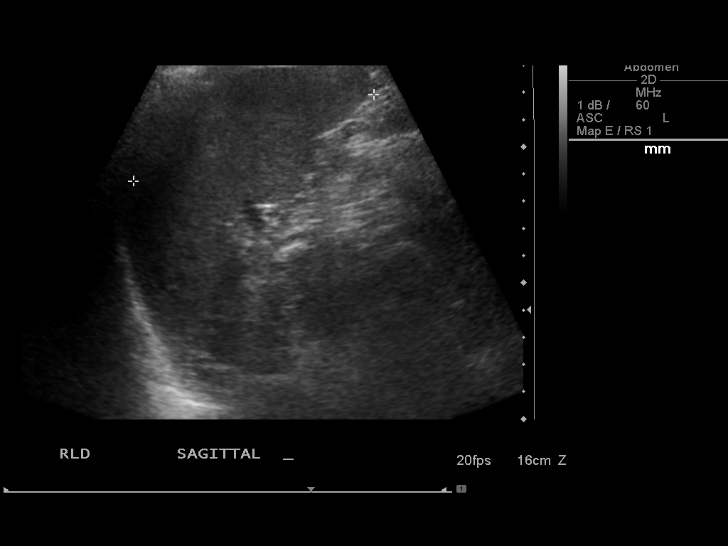
[im 65/87]
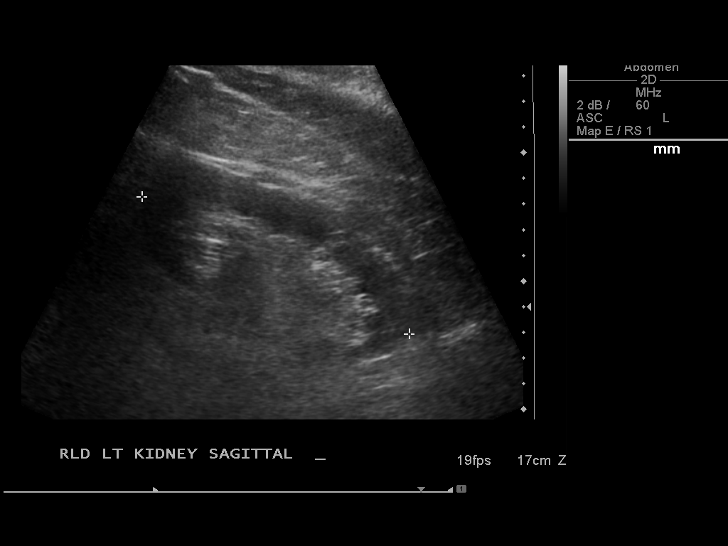
[im 72/87]
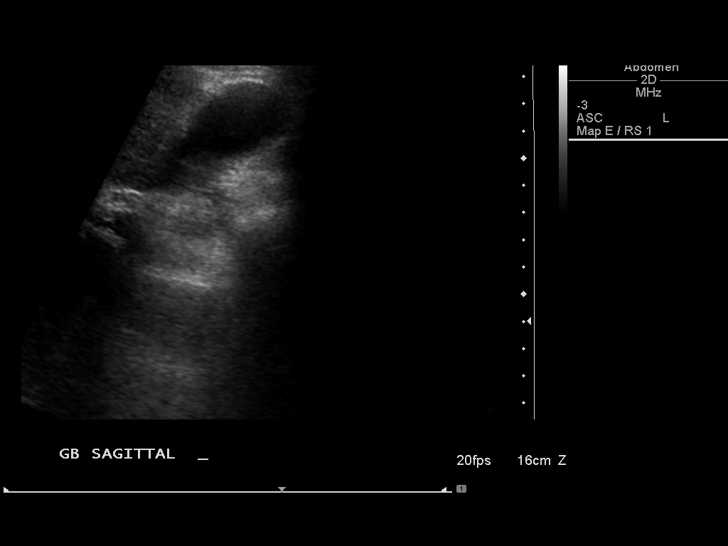
[im 79/87]
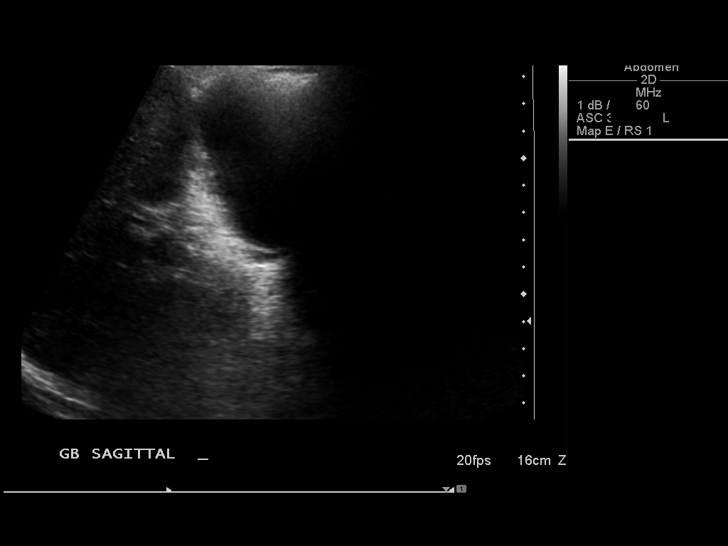
[im 87/87]
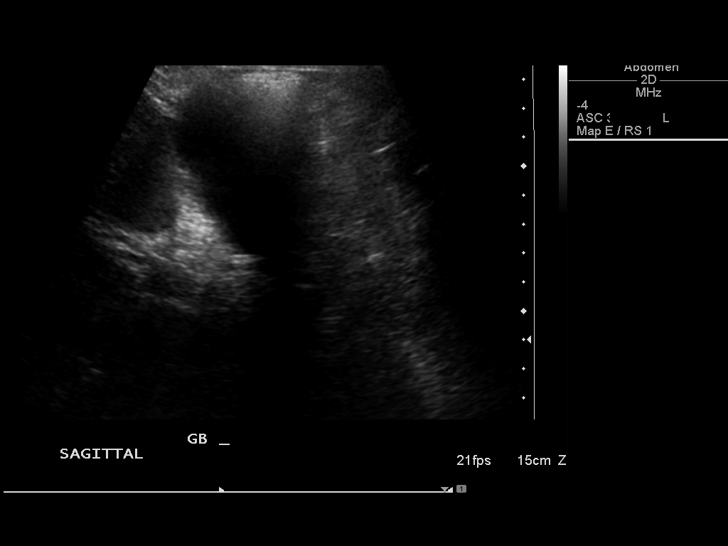

[13 of 25 positions shown; findings below may reference images not displayed]

FINDINGS: Gallbladder: Somewhat difficult to visualize due to patient habitus
and bowel gas, however no gallstones identified. No evidence of
gallbladder wall thickening.

Common bile duct: Measures 8 mm in diameter, within normal limits
for age.

Liver: No focal lesion identified. Within normal limits in
parenchymal echogenicity.

IVC:  Appears normal.

Pancreas:  Not well visualized due to overlying bowel gas.

Spleen: Within normal limits in size and echogenicity

Right Kidney: Measures 11.4 cm in length. Increased parenchymal
echogenicity, consistent with medical renal disease. No evidence of
hydronephrosis. The 2 cm subcapsular cyst seen in the anterior
midpole. No mass visualized.

Left Kidney: Measures 12.6 cm in length. Mildly increased
parenchymal echogenicity consistent with medical renal disease. No
evidence of hydronephrosis or mass.

Abdominal aorta:  No aneurysm identified.
IMPRESSION: No evidence of gallstones or biliary dilatation.

Increased renal parenchymal echogenicity, consistent with medical
renal disease. No evidence of hydronephrosis.

## 2014-05-11 DIAGNOSIS — N183 Chronic kidney disease, stage 3 (moderate): Secondary | ICD-10-CM | POA: Diagnosis not present

## 2014-05-11 DIAGNOSIS — D696 Thrombocytopenia, unspecified: Secondary | ICD-10-CM | POA: Diagnosis not present

## 2014-05-11 DIAGNOSIS — E782 Mixed hyperlipidemia: Secondary | ICD-10-CM | POA: Diagnosis not present

## 2014-05-11 DIAGNOSIS — C61 Malignant neoplasm of prostate: Secondary | ICD-10-CM | POA: Diagnosis not present

## 2014-05-11 DIAGNOSIS — I10 Essential (primary) hypertension: Secondary | ICD-10-CM | POA: Diagnosis not present

## 2014-05-11 DIAGNOSIS — K219 Gastro-esophageal reflux disease without esophagitis: Secondary | ICD-10-CM | POA: Diagnosis not present

## 2014-09-13 DIAGNOSIS — K219 Gastro-esophageal reflux disease without esophagitis: Secondary | ICD-10-CM | POA: Diagnosis not present

## 2014-09-13 DIAGNOSIS — E782 Mixed hyperlipidemia: Secondary | ICD-10-CM | POA: Diagnosis not present

## 2014-09-13 DIAGNOSIS — I10 Essential (primary) hypertension: Secondary | ICD-10-CM | POA: Diagnosis not present

## 2014-09-13 DIAGNOSIS — C61 Malignant neoplasm of prostate: Secondary | ICD-10-CM | POA: Diagnosis not present

## 2014-09-13 DIAGNOSIS — N183 Chronic kidney disease, stage 3 (moderate): Secondary | ICD-10-CM | POA: Diagnosis not present

## 2014-09-13 DIAGNOSIS — C189 Malignant neoplasm of colon, unspecified: Secondary | ICD-10-CM | POA: Diagnosis not present

## 2014-09-13 DIAGNOSIS — D696 Thrombocytopenia, unspecified: Secondary | ICD-10-CM | POA: Diagnosis not present

## 2015-01-20 ENCOUNTER — Other Ambulatory Visit: Payer: Self-pay | Admitting: Internal Medicine

## 2015-01-20 DIAGNOSIS — E782 Mixed hyperlipidemia: Secondary | ICD-10-CM | POA: Diagnosis not present

## 2015-01-20 DIAGNOSIS — R52 Pain, unspecified: Secondary | ICD-10-CM

## 2015-01-20 DIAGNOSIS — Z0000 Encounter for general adult medical examination without abnormal findings: Secondary | ICD-10-CM | POA: Diagnosis not present

## 2015-01-20 DIAGNOSIS — M1990 Unspecified osteoarthritis, unspecified site: Secondary | ICD-10-CM | POA: Diagnosis not present

## 2015-01-20 DIAGNOSIS — N183 Chronic kidney disease, stage 3 (moderate): Secondary | ICD-10-CM | POA: Diagnosis not present

## 2015-01-20 DIAGNOSIS — R0989 Other specified symptoms and signs involving the circulatory and respiratory systems: Secondary | ICD-10-CM

## 2015-01-20 DIAGNOSIS — C189 Malignant neoplasm of colon, unspecified: Secondary | ICD-10-CM | POA: Diagnosis not present

## 2015-01-20 DIAGNOSIS — I10 Essential (primary) hypertension: Secondary | ICD-10-CM | POA: Diagnosis not present

## 2015-01-20 DIAGNOSIS — D696 Thrombocytopenia, unspecified: Secondary | ICD-10-CM | POA: Diagnosis not present

## 2015-01-20 DIAGNOSIS — R7309 Other abnormal glucose: Secondary | ICD-10-CM | POA: Diagnosis not present

## 2015-01-20 DIAGNOSIS — Z1389 Encounter for screening for other disorder: Secondary | ICD-10-CM | POA: Diagnosis not present

## 2015-01-20 DIAGNOSIS — Z23 Encounter for immunization: Secondary | ICD-10-CM | POA: Diagnosis not present

## 2015-01-20 DIAGNOSIS — C61 Malignant neoplasm of prostate: Secondary | ICD-10-CM | POA: Diagnosis not present

## 2015-01-25 ENCOUNTER — Ambulatory Visit
Admission: RE | Admit: 2015-01-25 | Discharge: 2015-01-25 | Disposition: A | Discharge status: Home / Self Care | Payer: Medicare Other | Source: Ambulatory Visit | Attending: Internal Medicine | Admitting: Internal Medicine

## 2015-01-25 DIAGNOSIS — R0989 Other specified symptoms and signs involving the circulatory and respiratory systems: Secondary | ICD-10-CM

## 2015-01-25 DIAGNOSIS — I739 Peripheral vascular disease, unspecified: Secondary | ICD-10-CM | POA: Diagnosis not present

## 2015-01-25 DIAGNOSIS — R52 Pain, unspecified: Secondary | ICD-10-CM

## 2015-02-02 ENCOUNTER — Other Ambulatory Visit: Payer: Self-pay | Admitting: *Deleted

## 2015-02-02 DIAGNOSIS — I739 Peripheral vascular disease, unspecified: Secondary | ICD-10-CM

## 2015-03-16 ENCOUNTER — Encounter: Payer: Self-pay | Admitting: Vascular Surgery

## 2015-03-20 ENCOUNTER — Encounter: Payer: Self-pay | Admitting: Vascular Surgery

## 2015-03-21 ENCOUNTER — Ambulatory Visit (HOSPITAL_COMMUNITY)
Admission: RE | Admit: 2015-03-21 | Discharge: 2015-03-21 | Disposition: A | Discharge status: Home / Self Care | Payer: Medicare Other | Source: Ambulatory Visit | Attending: Vascular Surgery | Admitting: Vascular Surgery

## 2015-03-21 ENCOUNTER — Ambulatory Visit (INDEPENDENT_AMBULATORY_CARE_PROVIDER_SITE_OTHER)
Admission: RE | Admit: 2015-03-21 | Discharge: 2015-03-21 | Disposition: A | Discharge status: Home / Self Care | Payer: Medicare Other | Source: Ambulatory Visit | Attending: Vascular Surgery | Admitting: Vascular Surgery

## 2015-03-21 ENCOUNTER — Ambulatory Visit (INDEPENDENT_AMBULATORY_CARE_PROVIDER_SITE_OTHER): Payer: Medicare Other | Admitting: Vascular Surgery

## 2015-03-21 ENCOUNTER — Encounter: Payer: Self-pay | Admitting: Vascular Surgery

## 2015-03-21 VITALS — BP 168/83 | HR 69 | Ht 74.0 in | Wt 185.0 lb

## 2015-03-21 DIAGNOSIS — I70213 Atherosclerosis of native arteries of extremities with intermittent claudication, bilateral legs: Secondary | ICD-10-CM | POA: Diagnosis not present

## 2015-03-21 DIAGNOSIS — I739 Peripheral vascular disease, unspecified: Secondary | ICD-10-CM

## 2015-03-21 NOTE — Progress Notes (Signed)
HISTORY AND PHYSICAL   History of Present Illness:  Patient is a 81 y.o. year old male who presents for evaluation of claudication.  The patient currently describes a cramping sensation in the both lower extremity. He states he can't walk more than 75 feet prior to having to stop and rest due to calf pain.  There is not rest pain.  There is no history of ulcerations on the feet.  Atherosclerotic risk factors and other medical problems include hypertension managed with a beta blocker and hypercholesterolemia managed with a statin.  Past Medical History  Diagnosis Date  . Hypertension   . Hypercholesteremia   . Hard of hearing   . Cancer [facility])     colon    Past Surgical History  Procedure Laterality Date  . Lung surgery    . Radioactive seed implant    . Colon resection      ROS:   General:  No weight loss, Fever, chills  HEENT: No recent headaches, no nasal bleeding, no visual changes, no sore throat  Neurologic: No dizziness, blackouts, seizures. No recent symptoms of stroke or mini- stroke. No recent episodes of slurred speech, or temporary blindness.  Cardiac: No recent episodes of chest pain/pressure, no shortness of breath at rest.  No shortness of breath with exertion.  Denies history of atrial fibrillation or irregular heartbeat  Vascular: No history of rest pain in feet.  No history of claudication.  No history of non-healing ulcer, No history of DVT   Pulmonary: No home oxygen, no productive cough, no hemoptysis,  No asthma or wheezing  Musculoskeletal:  [ ]  Arthritis, [ ]  Low back pain,  [ ]  Joint pain  Hematologic:No history of hypercoagulable state.  No history of easy bleeding.  No history of anemia  Gastrointestinal: No hematochezia or melena,  No gastroesophageal reflux, no trouble swallowing  Urinary: [ ]  chronic Kidney disease, [ ]  on HD - [ ]  MWF or [ ]  TTHS, [ ]  Burning with urination, [ ]  Frequent urination, [ ]  Difficulty urinating;   Skin: No  rashes  Psychological: No history of anxiety,  No history of depression  Social History Social History  Substance Use Topics  . Smoking status: Former Research scientist (life sciences)  . Smokeless tobacco: None  . Alcohol Use: No    Family History History reviewed. No pertinent family history.  Allergies  Allergies  Allergen Reactions  . Lipitor [Atorvastatin] Other (See Comments)    Muscle pain, stiffness     Current Outpatient Prescriptions  Medication Sig Dispense Refill  . aspirin EC 81 MG tablet Take 81 mg by mouth every morning.    . cholecalciferol (VITAMIN D) 1000 UNITS tablet Take 1,000 Units by mouth daily.    Marland Kitchen doxazosin (CARDURA) 4 MG tablet Take 4 mg by mouth at bedtime.    . enalapril (VASOTEC) 10 MG tablet Take 10 mg by mouth 2 (two) times daily.    . famotidine (PEPCID) 40 MG tablet   1  . furosemide (LASIX) 40 MG tablet Take 40 mg by mouth every morning.    . lovastatin (MEVACOR) 20 MG tablet Take 20 mg by mouth at bedtime.    . Magnesium 200 MG TABS Take by mouth.    . metoprolol (LOPRESSOR) 50 MG tablet Take 50 mg by mouth 2 (two) times daily.    . Multiple Vitamin (MULTIVITAMIN WITH MINERALS) TABS tablet Take 1 tablet by mouth every morning.    . polyethylene glycol powder (GLYCOLAX/MIRALAX) powder  0  . Potassium 99 MG TABS Take by mouth.    . ondansetron (ZOFRAN ODT) 4 MG disintegrating tablet 4mg  ODT q4 hours prn nausea/vomit (Patient not taking: Reported on 03/21/2015) 8 tablet 0  . OVER THE COUNTER MEDICATION Take 1 tablet by mouth 2 (two) times daily as needed (pain). OTC pain reliever    . oxyCODONE-acetaminophen (PERCOCET) 5-325 MG per tablet Take 1 tablet by mouth every 6 (six) hours as needed for pain. (Patient not taking: Reported on 03/21/2015) 8 tablet 0   No current facility-administered medications for this visit.    Physical Examination  Filed Vitals:   03/21/15 1458  BP: 168/83  Pulse: 69  Height: 6\' 2"  (1.88 m)  Weight: 185 lb (83.915 kg)  SpO2: 96%      Body mass index is 23.74 kg/(m^2).  General:  Alert and oriented, no acute distress HEENT: Normal Neck: No bruit or JVD Pulmonary: Clear to auscultation bilaterally Cardiac: Regular Rate and Rhythm without murmur Abdomen: Soft, non-tender, non-distended, no mass, no scars Skin: No rash, varicose veins bilateral Extremity Pulses:  2+ radial, brachial, femoral, non palpable dorsalis pedis, posterior tibial pulses bilaterally -no evidence of gangrene, ischemia, ulceration.Musculoskeletal: No deformity or edema  Neurologic: Upper and lower extremity motor 5/5 and symmetric  DATA:  Today I ordered ABIs and bilateral duplex scanning of the lower extremities. Patient has diffuse atherosclerotic changes in the superficial femoral arteries throughout with multiple stenotic lesions. 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 No limb threatening ischemia at the present time.  Discussed the situation at length with patient and his daughter and son-in-law. Have offered angiography with possible PTA and stenting but this will likely require stenting of entire superficial femoral arteries bilaterally since he has diffuse disease. 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   also discussed this with the patient who states that he is not interested in any surgery.   PLAN:  Will encourage patient to increase his ambulation past the point of claudication to hopefully develop collateral flow  we'll only proceed with further investigation such as angiography and possible stenting if patient's symptoms worsen or if he develops any signs of limb threatening ischemia  If that should occur label be in touch with [place] for further evaluation  Patient and family are in agreement with this plan    [facility]

## 2015-05-24 DIAGNOSIS — R05 Cough: Secondary | ICD-10-CM | POA: Diagnosis not present

## 2015-05-24 DIAGNOSIS — N183 Chronic kidney disease, stage 3 (moderate): Secondary | ICD-10-CM | POA: Diagnosis not present

## 2015-05-24 DIAGNOSIS — C61 Malignant neoplasm of prostate: Secondary | ICD-10-CM | POA: Diagnosis not present

## 2015-05-24 DIAGNOSIS — K219 Gastro-esophageal reflux disease without esophagitis: Secondary | ICD-10-CM | POA: Diagnosis not present

## 2015-05-24 DIAGNOSIS — I739 Peripheral vascular disease, unspecified: Secondary | ICD-10-CM | POA: Diagnosis not present

## 2015-05-24 DIAGNOSIS — E782 Mixed hyperlipidemia: Secondary | ICD-10-CM | POA: Diagnosis not present

## 2015-05-24 DIAGNOSIS — I10 Essential (primary) hypertension: Secondary | ICD-10-CM | POA: Diagnosis not present

## 2015-07-05 DIAGNOSIS — N183 Chronic kidney disease, stage 3 (moderate): Secondary | ICD-10-CM | POA: Diagnosis not present

## 2015-07-05 DIAGNOSIS — I10 Essential (primary) hypertension: Secondary | ICD-10-CM | POA: Diagnosis not present

## 2015-09-21 DIAGNOSIS — C61 Malignant neoplasm of prostate: Secondary | ICD-10-CM | POA: Diagnosis not present

## 2015-09-21 DIAGNOSIS — E782 Mixed hyperlipidemia: Secondary | ICD-10-CM | POA: Diagnosis not present

## 2015-09-21 DIAGNOSIS — N183 Chronic kidney disease, stage 3 (moderate): Secondary | ICD-10-CM | POA: Diagnosis not present

## 2015-09-21 DIAGNOSIS — I10 Essential (primary) hypertension: Secondary | ICD-10-CM | POA: Diagnosis not present

## 2015-09-21 DIAGNOSIS — I739 Peripheral vascular disease, unspecified: Secondary | ICD-10-CM | POA: Diagnosis not present

## 2015-09-21 DIAGNOSIS — D696 Thrombocytopenia, unspecified: Secondary | ICD-10-CM | POA: Diagnosis not present

## 2015-09-21 DIAGNOSIS — K219 Gastro-esophageal reflux disease without esophagitis: Secondary | ICD-10-CM | POA: Diagnosis not present

## 2016-01-25 DIAGNOSIS — I739 Peripheral vascular disease, unspecified: Secondary | ICD-10-CM | POA: Diagnosis not present

## 2016-01-25 DIAGNOSIS — D696 Thrombocytopenia, unspecified: Secondary | ICD-10-CM | POA: Diagnosis not present

## 2016-01-25 DIAGNOSIS — R7309 Other abnormal glucose: Secondary | ICD-10-CM | POA: Diagnosis not present

## 2016-01-25 DIAGNOSIS — Z125 Encounter for screening for malignant neoplasm of prostate: Secondary | ICD-10-CM | POA: Diagnosis not present

## 2016-01-25 DIAGNOSIS — Z0000 Encounter for general adult medical examination without abnormal findings: Secondary | ICD-10-CM | POA: Diagnosis not present

## 2016-01-25 DIAGNOSIS — E782 Mixed hyperlipidemia: Secondary | ICD-10-CM | POA: Diagnosis not present

## 2016-01-25 DIAGNOSIS — K219 Gastro-esophageal reflux disease without esophagitis: Secondary | ICD-10-CM | POA: Diagnosis not present

## 2016-01-25 DIAGNOSIS — H9190 Unspecified hearing loss, unspecified ear: Secondary | ICD-10-CM | POA: Diagnosis not present

## 2016-01-25 DIAGNOSIS — Z1389 Encounter for screening for other disorder: Secondary | ICD-10-CM | POA: Diagnosis not present

## 2016-01-25 DIAGNOSIS — N183 Chronic kidney disease, stage 3 (moderate): Secondary | ICD-10-CM | POA: Diagnosis not present

## 2016-01-25 DIAGNOSIS — C189 Malignant neoplasm of colon, unspecified: Secondary | ICD-10-CM | POA: Diagnosis not present

## 2016-01-25 DIAGNOSIS — C61 Malignant neoplasm of prostate: Secondary | ICD-10-CM | POA: Diagnosis not present

## 2016-01-25 DIAGNOSIS — I10 Essential (primary) hypertension: Secondary | ICD-10-CM | POA: Diagnosis not present

## 2016-05-29 DIAGNOSIS — C61 Malignant neoplasm of prostate: Secondary | ICD-10-CM | POA: Diagnosis not present

## 2016-05-29 DIAGNOSIS — N183 Chronic kidney disease, stage 3 (moderate): Secondary | ICD-10-CM | POA: Diagnosis not present

## 2016-05-29 DIAGNOSIS — I10 Essential (primary) hypertension: Secondary | ICD-10-CM | POA: Diagnosis not present

## 2016-05-29 DIAGNOSIS — C189 Malignant neoplasm of colon, unspecified: Secondary | ICD-10-CM | POA: Diagnosis not present

## 2016-05-29 DIAGNOSIS — K219 Gastro-esophageal reflux disease without esophagitis: Secondary | ICD-10-CM | POA: Diagnosis not present

## 2016-05-29 DIAGNOSIS — E782 Mixed hyperlipidemia: Secondary | ICD-10-CM | POA: Diagnosis not present

## 2016-05-29 DIAGNOSIS — I739 Peripheral vascular disease, unspecified: Secondary | ICD-10-CM | POA: Diagnosis not present

## 2016-05-29 DIAGNOSIS — D696 Thrombocytopenia, unspecified: Secondary | ICD-10-CM | POA: Diagnosis not present

## 2016-09-26 DIAGNOSIS — C189 Malignant neoplasm of colon, unspecified: Secondary | ICD-10-CM | POA: Diagnosis not present

## 2016-09-26 DIAGNOSIS — I10 Essential (primary) hypertension: Secondary | ICD-10-CM | POA: Diagnosis not present

## 2016-09-26 DIAGNOSIS — D696 Thrombocytopenia, unspecified: Secondary | ICD-10-CM | POA: Diagnosis not present

## 2016-09-26 DIAGNOSIS — N183 Chronic kidney disease, stage 3 (moderate): Secondary | ICD-10-CM | POA: Diagnosis not present

## 2016-09-26 DIAGNOSIS — C61 Malignant neoplasm of prostate: Secondary | ICD-10-CM | POA: Diagnosis not present

## 2016-09-26 DIAGNOSIS — K219 Gastro-esophageal reflux disease without esophagitis: Secondary | ICD-10-CM | POA: Diagnosis not present

## 2016-09-26 DIAGNOSIS — I739 Peripheral vascular disease, unspecified: Secondary | ICD-10-CM | POA: Diagnosis not present

## 2016-09-26 DIAGNOSIS — E782 Mixed hyperlipidemia: Secondary | ICD-10-CM | POA: Diagnosis not present

## 2017-03-04 DIAGNOSIS — C189 Malignant neoplasm of colon, unspecified: Secondary | ICD-10-CM | POA: Diagnosis not present

## 2017-03-04 DIAGNOSIS — Z1389 Encounter for screening for other disorder: Secondary | ICD-10-CM | POA: Diagnosis not present

## 2017-03-04 DIAGNOSIS — K219 Gastro-esophageal reflux disease without esophagitis: Secondary | ICD-10-CM | POA: Diagnosis not present

## 2017-03-04 DIAGNOSIS — N183 Chronic kidney disease, stage 3 (moderate): Secondary | ICD-10-CM | POA: Diagnosis not present

## 2017-03-04 DIAGNOSIS — D696 Thrombocytopenia, unspecified: Secondary | ICD-10-CM | POA: Diagnosis not present

## 2017-03-04 DIAGNOSIS — Z23 Encounter for immunization: Secondary | ICD-10-CM | POA: Diagnosis not present

## 2017-03-04 DIAGNOSIS — E782 Mixed hyperlipidemia: Secondary | ICD-10-CM | POA: Diagnosis not present

## 2017-03-04 DIAGNOSIS — H9190 Unspecified hearing loss, unspecified ear: Secondary | ICD-10-CM | POA: Diagnosis not present

## 2017-03-04 DIAGNOSIS — M1990 Unspecified osteoarthritis, unspecified site: Secondary | ICD-10-CM | POA: Diagnosis not present

## 2017-03-04 DIAGNOSIS — I739 Peripheral vascular disease, unspecified: Secondary | ICD-10-CM | POA: Diagnosis not present

## 2017-03-04 DIAGNOSIS — Z125 Encounter for screening for malignant neoplasm of prostate: Secondary | ICD-10-CM | POA: Diagnosis not present

## 2017-03-04 DIAGNOSIS — Z0000 Encounter for general adult medical examination without abnormal findings: Secondary | ICD-10-CM | POA: Diagnosis not present

## 2017-03-04 DIAGNOSIS — C61 Malignant neoplasm of prostate: Secondary | ICD-10-CM | POA: Diagnosis not present

## 2017-03-04 DIAGNOSIS — I10 Essential (primary) hypertension: Secondary | ICD-10-CM | POA: Diagnosis not present

## 2017-04-04 DIAGNOSIS — I10 Essential (primary) hypertension: Secondary | ICD-10-CM | POA: Diagnosis not present

## 2017-04-04 DIAGNOSIS — R946 Abnormal results of thyroid function studies: Secondary | ICD-10-CM | POA: Diagnosis not present

## 2017-06-04 DIAGNOSIS — K5900 Constipation, unspecified: Secondary | ICD-10-CM | POA: Diagnosis not present

## 2017-06-04 DIAGNOSIS — E782 Mixed hyperlipidemia: Secondary | ICD-10-CM | POA: Diagnosis not present

## 2017-06-04 DIAGNOSIS — I739 Peripheral vascular disease, unspecified: Secondary | ICD-10-CM | POA: Diagnosis not present

## 2017-06-04 DIAGNOSIS — D696 Thrombocytopenia, unspecified: Secondary | ICD-10-CM | POA: Diagnosis not present

## 2017-06-04 DIAGNOSIS — N183 Chronic kidney disease, stage 3 (moderate): Secondary | ICD-10-CM | POA: Diagnosis not present

## 2017-06-04 DIAGNOSIS — Z1389 Encounter for screening for other disorder: Secondary | ICD-10-CM | POA: Diagnosis not present

## 2017-06-04 DIAGNOSIS — I10 Essential (primary) hypertension: Secondary | ICD-10-CM | POA: Diagnosis not present

## 2017-06-04 DIAGNOSIS — E039 Hypothyroidism, unspecified: Secondary | ICD-10-CM | POA: Diagnosis not present

## 2017-06-04 DIAGNOSIS — C61 Malignant neoplasm of prostate: Secondary | ICD-10-CM | POA: Diagnosis not present

## 2017-09-02 DIAGNOSIS — C189 Malignant neoplasm of colon, unspecified: Secondary | ICD-10-CM | POA: Diagnosis not present

## 2017-09-02 DIAGNOSIS — E782 Mixed hyperlipidemia: Secondary | ICD-10-CM | POA: Diagnosis not present

## 2017-09-02 DIAGNOSIS — D696 Thrombocytopenia, unspecified: Secondary | ICD-10-CM | POA: Diagnosis not present

## 2017-09-02 DIAGNOSIS — I10 Essential (primary) hypertension: Secondary | ICD-10-CM | POA: Diagnosis not present

## 2017-09-02 DIAGNOSIS — E039 Hypothyroidism, unspecified: Secondary | ICD-10-CM | POA: Diagnosis not present

## 2017-09-02 DIAGNOSIS — K5900 Constipation, unspecified: Secondary | ICD-10-CM | POA: Diagnosis not present

## 2017-09-02 DIAGNOSIS — M1990 Unspecified osteoarthritis, unspecified site: Secondary | ICD-10-CM | POA: Diagnosis not present

## 2017-09-02 DIAGNOSIS — C61 Malignant neoplasm of prostate: Secondary | ICD-10-CM | POA: Diagnosis not present

## 2017-09-02 DIAGNOSIS — N183 Chronic kidney disease, stage 3 (moderate): Secondary | ICD-10-CM | POA: Diagnosis not present

## 2018-01-02 DIAGNOSIS — N183 Chronic kidney disease, stage 3 (moderate): Secondary | ICD-10-CM | POA: Diagnosis not present

## 2018-01-02 DIAGNOSIS — C61 Malignant neoplasm of prostate: Secondary | ICD-10-CM | POA: Diagnosis not present

## 2018-01-02 DIAGNOSIS — E782 Mixed hyperlipidemia: Secondary | ICD-10-CM | POA: Diagnosis not present

## 2018-01-02 DIAGNOSIS — D696 Thrombocytopenia, unspecified: Secondary | ICD-10-CM | POA: Diagnosis not present

## 2018-01-02 DIAGNOSIS — K5900 Constipation, unspecified: Secondary | ICD-10-CM | POA: Diagnosis not present

## 2018-01-02 DIAGNOSIS — I739 Peripheral vascular disease, unspecified: Secondary | ICD-10-CM | POA: Diagnosis not present

## 2018-01-02 DIAGNOSIS — I10 Essential (primary) hypertension: Secondary | ICD-10-CM | POA: Diagnosis not present

## 2018-01-02 DIAGNOSIS — E039 Hypothyroidism, unspecified: Secondary | ICD-10-CM | POA: Diagnosis not present

## 2018-01-02 DIAGNOSIS — Z23 Encounter for immunization: Secondary | ICD-10-CM | POA: Diagnosis not present

## 2018-01-28 DIAGNOSIS — E782 Mixed hyperlipidemia: Secondary | ICD-10-CM | POA: Diagnosis not present

## 2018-01-28 DIAGNOSIS — C61 Malignant neoplasm of prostate: Secondary | ICD-10-CM | POA: Diagnosis not present

## 2018-01-28 DIAGNOSIS — E039 Hypothyroidism, unspecified: Secondary | ICD-10-CM | POA: Diagnosis not present

## 2018-01-28 DIAGNOSIS — N183 Chronic kidney disease, stage 3 (moderate): Secondary | ICD-10-CM | POA: Diagnosis not present

## 2018-01-28 DIAGNOSIS — I10 Essential (primary) hypertension: Secondary | ICD-10-CM | POA: Diagnosis not present

## 2018-01-28 DIAGNOSIS — M1990 Unspecified osteoarthritis, unspecified site: Secondary | ICD-10-CM | POA: Diagnosis not present

## 2018-01-28 DIAGNOSIS — C189 Malignant neoplasm of colon, unspecified: Secondary | ICD-10-CM | POA: Diagnosis not present

## 2018-05-22 DIAGNOSIS — E039 Hypothyroidism, unspecified: Secondary | ICD-10-CM | POA: Diagnosis not present

## 2018-05-22 DIAGNOSIS — D696 Thrombocytopenia, unspecified: Secondary | ICD-10-CM | POA: Diagnosis not present

## 2018-05-22 DIAGNOSIS — H9190 Unspecified hearing loss, unspecified ear: Secondary | ICD-10-CM | POA: Diagnosis not present

## 2018-05-22 DIAGNOSIS — Z1389 Encounter for screening for other disorder: Secondary | ICD-10-CM | POA: Diagnosis not present

## 2018-05-22 DIAGNOSIS — I10 Essential (primary) hypertension: Secondary | ICD-10-CM | POA: Diagnosis not present

## 2018-05-22 DIAGNOSIS — Z8546 Personal history of malignant neoplasm of prostate: Secondary | ICD-10-CM | POA: Diagnosis not present

## 2018-05-22 DIAGNOSIS — I739 Peripheral vascular disease, unspecified: Secondary | ICD-10-CM | POA: Diagnosis not present

## 2018-05-22 DIAGNOSIS — Z7189 Other specified counseling: Secondary | ICD-10-CM | POA: Diagnosis not present

## 2018-05-22 DIAGNOSIS — E782 Mixed hyperlipidemia: Secondary | ICD-10-CM | POA: Diagnosis not present

## 2018-05-22 DIAGNOSIS — Z85038 Personal history of other malignant neoplasm of large intestine: Secondary | ICD-10-CM | POA: Diagnosis not present

## 2018-05-22 DIAGNOSIS — Z0000 Encounter for general adult medical examination without abnormal findings: Secondary | ICD-10-CM | POA: Diagnosis not present

## 2018-05-22 DIAGNOSIS — M1990 Unspecified osteoarthritis, unspecified site: Secondary | ICD-10-CM | POA: Diagnosis not present

## 2018-05-22 DIAGNOSIS — R7309 Other abnormal glucose: Secondary | ICD-10-CM | POA: Diagnosis not present

## 2018-05-22 DIAGNOSIS — N183 Chronic kidney disease, stage 3 (moderate): Secondary | ICD-10-CM | POA: Diagnosis not present

## 2018-05-22 DIAGNOSIS — K5900 Constipation, unspecified: Secondary | ICD-10-CM | POA: Diagnosis not present

## 2018-06-24 DIAGNOSIS — N183 Chronic kidney disease, stage 3 (moderate): Secondary | ICD-10-CM | POA: Diagnosis not present

## 2018-06-24 DIAGNOSIS — E039 Hypothyroidism, unspecified: Secondary | ICD-10-CM | POA: Diagnosis not present

## 2018-06-24 DIAGNOSIS — C189 Malignant neoplasm of colon, unspecified: Secondary | ICD-10-CM | POA: Diagnosis not present

## 2018-06-24 DIAGNOSIS — E782 Mixed hyperlipidemia: Secondary | ICD-10-CM | POA: Diagnosis not present

## 2018-06-24 DIAGNOSIS — I10 Essential (primary) hypertension: Secondary | ICD-10-CM | POA: Diagnosis not present

## 2018-06-24 DIAGNOSIS — C61 Malignant neoplasm of prostate: Secondary | ICD-10-CM | POA: Diagnosis not present

## 2018-06-24 DIAGNOSIS — M1990 Unspecified osteoarthritis, unspecified site: Secondary | ICD-10-CM | POA: Diagnosis not present

## 2018-07-08 DIAGNOSIS — N179 Acute kidney failure, unspecified: Secondary | ICD-10-CM | POA: Diagnosis not present

## 2018-07-08 DIAGNOSIS — E785 Hyperlipidemia, unspecified: Secondary | ICD-10-CM | POA: Diagnosis not present

## 2018-07-08 DIAGNOSIS — N189 Chronic kidney disease, unspecified: Secondary | ICD-10-CM | POA: Diagnosis not present

## 2018-07-08 DIAGNOSIS — Z87891 Personal history of nicotine dependence: Secondary | ICD-10-CM | POA: Diagnosis not present

## 2018-07-08 DIAGNOSIS — M7989 Other specified soft tissue disorders: Secondary | ICD-10-CM | POA: Diagnosis not present

## 2018-07-08 DIAGNOSIS — L089 Local infection of the skin and subcutaneous tissue, unspecified: Secondary | ICD-10-CM | POA: Diagnosis not present

## 2018-07-08 DIAGNOSIS — L03116 Cellulitis of left lower limb: Secondary | ICD-10-CM | POA: Diagnosis not present

## 2018-07-08 DIAGNOSIS — M79605 Pain in left leg: Secondary | ICD-10-CM | POA: Diagnosis not present

## 2018-07-08 DIAGNOSIS — I739 Peripheral vascular disease, unspecified: Secondary | ICD-10-CM | POA: Diagnosis not present

## 2018-07-08 DIAGNOSIS — I129 Hypertensive chronic kidney disease with stage 1 through stage 4 chronic kidney disease, or unspecified chronic kidney disease: Secondary | ICD-10-CM | POA: Diagnosis not present

## 2018-07-08 DIAGNOSIS — M79672 Pain in left foot: Secondary | ICD-10-CM | POA: Diagnosis not present

## 2018-07-08 DIAGNOSIS — I70202 Unspecified atherosclerosis of native arteries of extremities, left leg: Secondary | ICD-10-CM | POA: Diagnosis not present

## 2018-07-09 DIAGNOSIS — I10 Essential (primary) hypertension: Secondary | ICD-10-CM | POA: Diagnosis not present

## 2018-07-09 DIAGNOSIS — N400 Enlarged prostate without lower urinary tract symptoms: Secondary | ICD-10-CM | POA: Diagnosis present

## 2018-07-09 DIAGNOSIS — Z8546 Personal history of malignant neoplasm of prostate: Secondary | ICD-10-CM | POA: Diagnosis not present

## 2018-07-09 DIAGNOSIS — I7025 Atherosclerosis of native arteries of other extremities with ulceration: Secondary | ICD-10-CM | POA: Diagnosis present

## 2018-07-09 DIAGNOSIS — L97509 Non-pressure chronic ulcer of other part of unspecified foot with unspecified severity: Secondary | ICD-10-CM | POA: Diagnosis present

## 2018-07-09 DIAGNOSIS — L97529 Non-pressure chronic ulcer of other part of left foot with unspecified severity: Secondary | ICD-10-CM | POA: Diagnosis not present

## 2018-07-09 DIAGNOSIS — R001 Bradycardia, unspecified: Secondary | ICD-10-CM | POA: Diagnosis not present

## 2018-07-09 DIAGNOSIS — Z9049 Acquired absence of other specified parts of digestive tract: Secondary | ICD-10-CM | POA: Diagnosis not present

## 2018-07-09 DIAGNOSIS — I361 Nonrheumatic tricuspid (valve) insufficiency: Secondary | ICD-10-CM | POA: Diagnosis not present

## 2018-07-09 DIAGNOSIS — Z87891 Personal history of nicotine dependence: Secondary | ICD-10-CM | POA: Diagnosis not present

## 2018-07-09 DIAGNOSIS — R0609 Other forms of dyspnea: Secondary | ICD-10-CM | POA: Diagnosis not present

## 2018-07-09 DIAGNOSIS — I7092 Chronic total occlusion of artery of the extremities: Secondary | ICD-10-CM | POA: Diagnosis not present

## 2018-07-09 DIAGNOSIS — E782 Mixed hyperlipidemia: Secondary | ICD-10-CM | POA: Diagnosis not present

## 2018-07-09 DIAGNOSIS — I2510 Atherosclerotic heart disease of native coronary artery without angina pectoris: Secondary | ICD-10-CM | POA: Diagnosis not present

## 2018-07-09 DIAGNOSIS — S91105A Unspecified open wound of left lesser toe(s) without damage to nail, initial encounter: Secondary | ICD-10-CM | POA: Diagnosis not present

## 2018-07-09 DIAGNOSIS — Z923 Personal history of irradiation: Secondary | ICD-10-CM | POA: Diagnosis not present

## 2018-07-09 DIAGNOSIS — E785 Hyperlipidemia, unspecified: Secondary | ICD-10-CM | POA: Diagnosis not present

## 2018-07-09 DIAGNOSIS — N189 Chronic kidney disease, unspecified: Secondary | ICD-10-CM | POA: Diagnosis present

## 2018-07-09 DIAGNOSIS — R609 Edema, unspecified: Secondary | ICD-10-CM | POA: Diagnosis not present

## 2018-07-09 DIAGNOSIS — I70202 Unspecified atherosclerosis of native arteries of extremities, left leg: Secondary | ICD-10-CM | POA: Diagnosis present

## 2018-07-09 DIAGNOSIS — E039 Hypothyroidism, unspecified: Secondary | ICD-10-CM | POA: Diagnosis present

## 2018-07-09 DIAGNOSIS — I779 Disorder of arteries and arterioles, unspecified: Secondary | ICD-10-CM | POA: Diagnosis not present

## 2018-07-09 DIAGNOSIS — I959 Hypotension, unspecified: Secondary | ICD-10-CM | POA: Diagnosis not present

## 2018-07-09 DIAGNOSIS — I70245 Atherosclerosis of native arteries of left leg with ulceration of other part of foot: Secondary | ICD-10-CM | POA: Diagnosis not present

## 2018-07-09 DIAGNOSIS — I517 Cardiomegaly: Secondary | ICD-10-CM | POA: Diagnosis not present

## 2018-07-09 DIAGNOSIS — Z7982 Long term (current) use of aspirin: Secondary | ICD-10-CM | POA: Diagnosis not present

## 2018-07-09 DIAGNOSIS — L0390 Cellulitis, unspecified: Secondary | ICD-10-CM | POA: Diagnosis not present

## 2018-07-09 DIAGNOSIS — I129 Hypertensive chronic kidney disease with stage 1 through stage 4 chronic kidney disease, or unspecified chronic kidney disease: Secondary | ICD-10-CM | POA: Diagnosis present

## 2018-07-09 DIAGNOSIS — Z79899 Other long term (current) drug therapy: Secondary | ICD-10-CM | POA: Diagnosis not present

## 2018-07-09 DIAGNOSIS — I513 Intracardiac thrombosis, not elsewhere classified: Secondary | ICD-10-CM | POA: Diagnosis present

## 2018-07-09 DIAGNOSIS — N179 Acute kidney failure, unspecified: Secondary | ICD-10-CM | POA: Diagnosis present

## 2018-07-09 DIAGNOSIS — Z85038 Personal history of other malignant neoplasm of large intestine: Secondary | ICD-10-CM | POA: Diagnosis not present

## 2018-07-09 DIAGNOSIS — S91109A Unspecified open wound of unspecified toe(s) without damage to nail, initial encounter: Secondary | ICD-10-CM | POA: Diagnosis present

## 2018-07-09 DIAGNOSIS — Z7902 Long term (current) use of antithrombotics/antiplatelets: Secondary | ICD-10-CM | POA: Diagnosis not present

## 2018-07-09 DIAGNOSIS — K219 Gastro-esophageal reflux disease without esophagitis: Secondary | ICD-10-CM | POA: Diagnosis present

## 2018-07-09 DIAGNOSIS — L03116 Cellulitis of left lower limb: Secondary | ICD-10-CM | POA: Diagnosis present

## 2018-08-24 DIAGNOSIS — I10 Essential (primary) hypertension: Secondary | ICD-10-CM | POA: Diagnosis not present

## 2018-08-24 DIAGNOSIS — N183 Chronic kidney disease, stage 3 (moderate): Secondary | ICD-10-CM | POA: Diagnosis not present

## 2018-08-24 DIAGNOSIS — E039 Hypothyroidism, unspecified: Secondary | ICD-10-CM | POA: Diagnosis not present

## 2018-08-24 DIAGNOSIS — C61 Malignant neoplasm of prostate: Secondary | ICD-10-CM | POA: Diagnosis not present

## 2018-08-24 DIAGNOSIS — C189 Malignant neoplasm of colon, unspecified: Secondary | ICD-10-CM | POA: Diagnosis not present

## 2018-08-24 DIAGNOSIS — E782 Mixed hyperlipidemia: Secondary | ICD-10-CM | POA: Diagnosis not present

## 2018-08-24 DIAGNOSIS — M1990 Unspecified osteoarthritis, unspecified site: Secondary | ICD-10-CM | POA: Diagnosis not present

## 2019-03-05 DIAGNOSIS — Z23 Encounter for immunization: Secondary | ICD-10-CM | POA: Diagnosis not present

## 2019-06-14 DIAGNOSIS — E039 Hypothyroidism, unspecified: Secondary | ICD-10-CM | POA: Diagnosis not present

## 2019-06-14 DIAGNOSIS — Z136 Encounter for screening for cardiovascular disorders: Secondary | ICD-10-CM | POA: Diagnosis not present

## 2019-06-14 DIAGNOSIS — I10 Essential (primary) hypertension: Secondary | ICD-10-CM | POA: Diagnosis not present

## 2019-06-14 DIAGNOSIS — N183 Chronic kidney disease, stage 3 unspecified: Secondary | ICD-10-CM | POA: Diagnosis not present

## 2019-06-14 DIAGNOSIS — E785 Hyperlipidemia, unspecified: Secondary | ICD-10-CM | POA: Diagnosis not present

## 2019-06-14 DIAGNOSIS — I739 Peripheral vascular disease, unspecified: Secondary | ICD-10-CM | POA: Diagnosis not present

## 2019-06-14 DIAGNOSIS — K219 Gastro-esophageal reflux disease without esophagitis: Secondary | ICD-10-CM | POA: Diagnosis not present

## 2019-07-12 DIAGNOSIS — I10 Essential (primary) hypertension: Secondary | ICD-10-CM | POA: Diagnosis not present

## 2019-07-12 DIAGNOSIS — K219 Gastro-esophageal reflux disease without esophagitis: Secondary | ICD-10-CM | POA: Diagnosis not present

## 2019-07-12 DIAGNOSIS — N183 Chronic kidney disease, stage 3 unspecified: Secondary | ICD-10-CM | POA: Diagnosis not present

## 2019-07-12 DIAGNOSIS — I129 Hypertensive chronic kidney disease with stage 1 through stage 4 chronic kidney disease, or unspecified chronic kidney disease: Secondary | ICD-10-CM | POA: Diagnosis not present

## 2019-07-12 DIAGNOSIS — E785 Hyperlipidemia, unspecified: Secondary | ICD-10-CM | POA: Diagnosis not present

## 2019-07-12 DIAGNOSIS — E039 Hypothyroidism, unspecified: Secondary | ICD-10-CM | POA: Diagnosis not present

## 2019-07-12 DIAGNOSIS — I739 Peripheral vascular disease, unspecified: Secondary | ICD-10-CM | POA: Diagnosis not present

## 2020-06-12 DIAGNOSIS — D696 Thrombocytopenia, unspecified: Secondary | ICD-10-CM | POA: Diagnosis not present

## 2020-06-12 DIAGNOSIS — I129 Hypertensive chronic kidney disease with stage 1 through stage 4 chronic kidney disease, or unspecified chronic kidney disease: Secondary | ICD-10-CM | POA: Diagnosis not present

## 2020-06-12 DIAGNOSIS — Z6824 Body mass index (BMI) 24.0-24.9, adult: Secondary | ICD-10-CM | POA: Diagnosis not present

## 2020-06-12 DIAGNOSIS — I10 Essential (primary) hypertension: Secondary | ICD-10-CM | POA: Diagnosis not present

## 2020-06-12 DIAGNOSIS — Z8546 Personal history of malignant neoplasm of prostate: Secondary | ICD-10-CM | POA: Diagnosis not present

## 2020-06-12 DIAGNOSIS — Z85038 Personal history of other malignant neoplasm of large intestine: Secondary | ICD-10-CM | POA: Diagnosis not present

## 2020-06-12 DIAGNOSIS — N183 Chronic kidney disease, stage 3 unspecified: Secondary | ICD-10-CM | POA: Diagnosis not present

## 2020-06-12 DIAGNOSIS — Z85118 Personal history of other malignant neoplasm of bronchus and lung: Secondary | ICD-10-CM | POA: Diagnosis not present

## 2020-06-12 DIAGNOSIS — E039 Hypothyroidism, unspecified: Secondary | ICD-10-CM | POA: Diagnosis not present

## 2020-06-12 DIAGNOSIS — R634 Abnormal weight loss: Secondary | ICD-10-CM | POA: Diagnosis not present

## 2020-06-12 DIAGNOSIS — K219 Gastro-esophageal reflux disease without esophagitis: Secondary | ICD-10-CM | POA: Diagnosis not present

## 2020-06-12 DIAGNOSIS — E785 Hyperlipidemia, unspecified: Secondary | ICD-10-CM | POA: Diagnosis not present

## 2020-06-12 DIAGNOSIS — I739 Peripheral vascular disease, unspecified: Secondary | ICD-10-CM | POA: Diagnosis not present

## 2020-06-17 DIAGNOSIS — R1314 Dysphagia, pharyngoesophageal phase: Secondary | ICD-10-CM | POA: Diagnosis not present

## 2020-06-17 DIAGNOSIS — J9601 Acute respiratory failure with hypoxia: Secondary | ICD-10-CM | POA: Diagnosis present

## 2020-06-17 DIAGNOSIS — R0902 Hypoxemia: Secondary | ICD-10-CM | POA: Diagnosis not present

## 2020-06-17 DIAGNOSIS — Z923 Personal history of irradiation: Secondary | ICD-10-CM | POA: Diagnosis not present

## 2020-06-17 DIAGNOSIS — Z20822 Contact with and (suspected) exposure to covid-19: Secondary | ICD-10-CM | POA: Diagnosis not present

## 2020-06-17 DIAGNOSIS — K449 Diaphragmatic hernia without obstruction or gangrene: Secondary | ICD-10-CM | POA: Diagnosis present

## 2020-06-17 DIAGNOSIS — R682 Dry mouth, unspecified: Secondary | ICD-10-CM | POA: Diagnosis not present

## 2020-06-17 DIAGNOSIS — Z9049 Acquired absence of other specified parts of digestive tract: Secondary | ICD-10-CM | POA: Diagnosis not present

## 2020-06-17 DIAGNOSIS — R0602 Shortness of breath: Secondary | ICD-10-CM | POA: Diagnosis not present

## 2020-06-17 DIAGNOSIS — R079 Chest pain, unspecified: Secondary | ICD-10-CM | POA: Diagnosis not present

## 2020-06-17 DIAGNOSIS — K222 Esophageal obstruction: Secondary | ICD-10-CM | POA: Diagnosis present

## 2020-06-17 DIAGNOSIS — Z7902 Long term (current) use of antithrombotics/antiplatelets: Secondary | ICD-10-CM | POA: Diagnosis not present

## 2020-06-17 DIAGNOSIS — E039 Hypothyroidism, unspecified: Secondary | ICD-10-CM | POA: Diagnosis present

## 2020-06-17 DIAGNOSIS — Z85038 Personal history of other malignant neoplasm of large intestine: Secondary | ICD-10-CM | POA: Diagnosis not present

## 2020-06-17 DIAGNOSIS — J189 Pneumonia, unspecified organism: Secondary | ICD-10-CM | POA: Diagnosis present

## 2020-06-17 DIAGNOSIS — J984 Other disorders of lung: Secondary | ICD-10-CM | POA: Diagnosis not present

## 2020-06-17 DIAGNOSIS — I10 Essential (primary) hypertension: Secondary | ICD-10-CM | POA: Diagnosis not present

## 2020-06-17 DIAGNOSIS — Z8546 Personal history of malignant neoplasm of prostate: Secondary | ICD-10-CM | POA: Diagnosis not present

## 2020-06-17 DIAGNOSIS — E785 Hyperlipidemia, unspecified: Secondary | ICD-10-CM | POA: Diagnosis present

## 2020-06-17 DIAGNOSIS — Z79899 Other long term (current) drug therapy: Secondary | ICD-10-CM | POA: Diagnosis not present

## 2020-06-17 DIAGNOSIS — R0789 Other chest pain: Secondary | ICD-10-CM | POA: Diagnosis not present

## 2020-06-17 DIAGNOSIS — E876 Hypokalemia: Secondary | ICD-10-CM | POA: Diagnosis not present

## 2020-06-17 DIAGNOSIS — Z79890 Hormone replacement therapy (postmenopausal): Secondary | ICD-10-CM | POA: Diagnosis not present

## 2020-06-17 DIAGNOSIS — N400 Enlarged prostate without lower urinary tract symptoms: Secondary | ICD-10-CM | POA: Diagnosis present

## 2020-06-17 DIAGNOSIS — Z85118 Personal history of other malignant neoplasm of bronchus and lung: Secondary | ICD-10-CM | POA: Diagnosis not present

## 2020-06-22 DIAGNOSIS — K449 Diaphragmatic hernia without obstruction or gangrene: Secondary | ICD-10-CM | POA: Diagnosis not present

## 2020-06-22 DIAGNOSIS — R1314 Dysphagia, pharyngoesophageal phase: Secondary | ICD-10-CM | POA: Diagnosis not present

## 2020-06-22 DIAGNOSIS — K222 Esophageal obstruction: Secondary | ICD-10-CM | POA: Diagnosis not present

## 2020-06-26 DIAGNOSIS — Z9981 Dependence on supplemental oxygen: Secondary | ICD-10-CM | POA: Diagnosis not present

## 2020-06-26 DIAGNOSIS — Z7902 Long term (current) use of antithrombotics/antiplatelets: Secondary | ICD-10-CM | POA: Diagnosis not present

## 2020-06-26 DIAGNOSIS — Z87891 Personal history of nicotine dependence: Secondary | ICD-10-CM | POA: Diagnosis not present

## 2020-06-26 DIAGNOSIS — E876 Hypokalemia: Secondary | ICD-10-CM | POA: Diagnosis not present

## 2020-06-26 DIAGNOSIS — Z8546 Personal history of malignant neoplasm of prostate: Secondary | ICD-10-CM | POA: Diagnosis not present

## 2020-06-26 DIAGNOSIS — J189 Pneumonia, unspecified organism: Secondary | ICD-10-CM | POA: Diagnosis not present

## 2020-06-26 DIAGNOSIS — E785 Hyperlipidemia, unspecified: Secondary | ICD-10-CM | POA: Diagnosis not present

## 2020-06-26 DIAGNOSIS — I10 Essential (primary) hypertension: Secondary | ICD-10-CM | POA: Diagnosis not present

## 2020-06-26 DIAGNOSIS — R1314 Dysphagia, pharyngoesophageal phase: Secondary | ICD-10-CM | POA: Diagnosis not present

## 2020-06-26 DIAGNOSIS — Z85038 Personal history of other malignant neoplasm of large intestine: Secondary | ICD-10-CM | POA: Diagnosis not present

## 2020-06-26 DIAGNOSIS — Z85118 Personal history of other malignant neoplasm of bronchus and lung: Secondary | ICD-10-CM | POA: Diagnosis not present

## 2020-06-26 DIAGNOSIS — J9601 Acute respiratory failure with hypoxia: Secondary | ICD-10-CM | POA: Diagnosis not present

## 2020-06-26 DIAGNOSIS — Z9181 History of falling: Secondary | ICD-10-CM | POA: Diagnosis not present

## 2020-06-27 DIAGNOSIS — E876 Hypokalemia: Secondary | ICD-10-CM | POA: Diagnosis not present

## 2020-06-27 DIAGNOSIS — R1314 Dysphagia, pharyngoesophageal phase: Secondary | ICD-10-CM | POA: Diagnosis not present

## 2020-06-27 DIAGNOSIS — I10 Essential (primary) hypertension: Secondary | ICD-10-CM | POA: Diagnosis not present

## 2020-06-27 DIAGNOSIS — J189 Pneumonia, unspecified organism: Secondary | ICD-10-CM | POA: Diagnosis not present

## 2020-06-27 DIAGNOSIS — J9601 Acute respiratory failure with hypoxia: Secondary | ICD-10-CM | POA: Diagnosis not present

## 2020-06-27 DIAGNOSIS — E785 Hyperlipidemia, unspecified: Secondary | ICD-10-CM | POA: Diagnosis not present

## 2020-06-28 DIAGNOSIS — Z8546 Personal history of malignant neoplasm of prostate: Secondary | ICD-10-CM | POA: Diagnosis not present

## 2020-06-28 DIAGNOSIS — K222 Esophageal obstruction: Secondary | ICD-10-CM | POA: Diagnosis not present

## 2020-06-28 DIAGNOSIS — D649 Anemia, unspecified: Secondary | ICD-10-CM | POA: Diagnosis not present

## 2020-06-28 DIAGNOSIS — I129 Hypertensive chronic kidney disease with stage 1 through stage 4 chronic kidney disease, or unspecified chronic kidney disease: Secondary | ICD-10-CM | POA: Diagnosis not present

## 2020-06-28 DIAGNOSIS — R634 Abnormal weight loss: Secondary | ICD-10-CM | POA: Diagnosis not present

## 2020-06-28 DIAGNOSIS — E039 Hypothyroidism, unspecified: Secondary | ICD-10-CM | POA: Diagnosis not present

## 2020-06-28 DIAGNOSIS — N183 Chronic kidney disease, stage 3 unspecified: Secondary | ICD-10-CM | POA: Diagnosis not present

## 2020-06-28 DIAGNOSIS — I739 Peripheral vascular disease, unspecified: Secondary | ICD-10-CM | POA: Diagnosis not present

## 2020-06-28 DIAGNOSIS — K219 Gastro-esophageal reflux disease without esophagitis: Secondary | ICD-10-CM | POA: Diagnosis not present

## 2020-06-28 DIAGNOSIS — Z85118 Personal history of other malignant neoplasm of bronchus and lung: Secondary | ICD-10-CM | POA: Diagnosis not present

## 2020-06-28 DIAGNOSIS — J189 Pneumonia, unspecified organism: Secondary | ICD-10-CM | POA: Diagnosis not present

## 2020-06-28 DIAGNOSIS — I10 Essential (primary) hypertension: Secondary | ICD-10-CM | POA: Diagnosis not present

## 2020-06-30 DIAGNOSIS — E876 Hypokalemia: Secondary | ICD-10-CM | POA: Diagnosis not present

## 2020-06-30 DIAGNOSIS — R1314 Dysphagia, pharyngoesophageal phase: Secondary | ICD-10-CM | POA: Diagnosis not present

## 2020-06-30 DIAGNOSIS — J189 Pneumonia, unspecified organism: Secondary | ICD-10-CM | POA: Diagnosis not present

## 2020-06-30 DIAGNOSIS — E785 Hyperlipidemia, unspecified: Secondary | ICD-10-CM | POA: Diagnosis not present

## 2020-06-30 DIAGNOSIS — J9601 Acute respiratory failure with hypoxia: Secondary | ICD-10-CM | POA: Diagnosis not present

## 2020-06-30 DIAGNOSIS — I10 Essential (primary) hypertension: Secondary | ICD-10-CM | POA: Diagnosis not present

## 2020-07-03 DIAGNOSIS — E785 Hyperlipidemia, unspecified: Secondary | ICD-10-CM | POA: Diagnosis not present

## 2020-07-03 DIAGNOSIS — R1314 Dysphagia, pharyngoesophageal phase: Secondary | ICD-10-CM | POA: Diagnosis not present

## 2020-07-03 DIAGNOSIS — E876 Hypokalemia: Secondary | ICD-10-CM | POA: Diagnosis not present

## 2020-07-03 DIAGNOSIS — I10 Essential (primary) hypertension: Secondary | ICD-10-CM | POA: Diagnosis not present

## 2020-07-03 DIAGNOSIS — J189 Pneumonia, unspecified organism: Secondary | ICD-10-CM | POA: Diagnosis not present

## 2020-07-03 DIAGNOSIS — J9601 Acute respiratory failure with hypoxia: Secondary | ICD-10-CM | POA: Diagnosis not present

## 2020-07-05 DIAGNOSIS — R1013 Epigastric pain: Secondary | ICD-10-CM | POA: Diagnosis not present

## 2020-07-05 DIAGNOSIS — I10 Essential (primary) hypertension: Secondary | ICD-10-CM | POA: Diagnosis not present

## 2020-07-05 DIAGNOSIS — R1314 Dysphagia, pharyngoesophageal phase: Secondary | ICD-10-CM | POA: Diagnosis not present

## 2020-07-05 DIAGNOSIS — R634 Abnormal weight loss: Secondary | ICD-10-CM | POA: Diagnosis not present

## 2020-07-05 DIAGNOSIS — J9601 Acute respiratory failure with hypoxia: Secondary | ICD-10-CM | POA: Diagnosis not present

## 2020-07-05 DIAGNOSIS — J189 Pneumonia, unspecified organism: Secondary | ICD-10-CM | POA: Diagnosis not present

## 2020-07-05 DIAGNOSIS — D500 Iron deficiency anemia secondary to blood loss (chronic): Secondary | ICD-10-CM | POA: Diagnosis not present

## 2020-07-05 DIAGNOSIS — E785 Hyperlipidemia, unspecified: Secondary | ICD-10-CM | POA: Diagnosis not present

## 2020-07-05 DIAGNOSIS — E876 Hypokalemia: Secondary | ICD-10-CM | POA: Diagnosis not present

## 2020-07-06 DIAGNOSIS — R1314 Dysphagia, pharyngoesophageal phase: Secondary | ICD-10-CM | POA: Diagnosis not present

## 2020-07-06 DIAGNOSIS — I10 Essential (primary) hypertension: Secondary | ICD-10-CM | POA: Diagnosis not present

## 2020-07-06 DIAGNOSIS — J189 Pneumonia, unspecified organism: Secondary | ICD-10-CM | POA: Diagnosis not present

## 2020-07-06 DIAGNOSIS — E876 Hypokalemia: Secondary | ICD-10-CM | POA: Diagnosis not present

## 2020-07-06 DIAGNOSIS — E785 Hyperlipidemia, unspecified: Secondary | ICD-10-CM | POA: Diagnosis not present

## 2020-07-06 DIAGNOSIS — J9601 Acute respiratory failure with hypoxia: Secondary | ICD-10-CM | POA: Diagnosis not present

## 2020-07-10 DIAGNOSIS — J9601 Acute respiratory failure with hypoxia: Secondary | ICD-10-CM | POA: Diagnosis not present

## 2020-07-10 DIAGNOSIS — E876 Hypokalemia: Secondary | ICD-10-CM | POA: Diagnosis not present

## 2020-07-10 DIAGNOSIS — E785 Hyperlipidemia, unspecified: Secondary | ICD-10-CM | POA: Diagnosis not present

## 2020-07-10 DIAGNOSIS — R1314 Dysphagia, pharyngoesophageal phase: Secondary | ICD-10-CM | POA: Diagnosis not present

## 2020-07-10 DIAGNOSIS — I10 Essential (primary) hypertension: Secondary | ICD-10-CM | POA: Diagnosis not present

## 2020-07-10 DIAGNOSIS — J189 Pneumonia, unspecified organism: Secondary | ICD-10-CM | POA: Diagnosis not present

## 2020-07-12 DIAGNOSIS — E785 Hyperlipidemia, unspecified: Secondary | ICD-10-CM | POA: Diagnosis not present

## 2020-07-12 DIAGNOSIS — E876 Hypokalemia: Secondary | ICD-10-CM | POA: Diagnosis not present

## 2020-07-12 DIAGNOSIS — I10 Essential (primary) hypertension: Secondary | ICD-10-CM | POA: Diagnosis not present

## 2020-07-12 DIAGNOSIS — J189 Pneumonia, unspecified organism: Secondary | ICD-10-CM | POA: Diagnosis not present

## 2020-07-12 DIAGNOSIS — R1314 Dysphagia, pharyngoesophageal phase: Secondary | ICD-10-CM | POA: Diagnosis not present

## 2020-07-12 DIAGNOSIS — J9601 Acute respiratory failure with hypoxia: Secondary | ICD-10-CM | POA: Diagnosis not present

## 2020-07-13 DIAGNOSIS — E785 Hyperlipidemia, unspecified: Secondary | ICD-10-CM | POA: Diagnosis not present

## 2020-07-13 DIAGNOSIS — R1314 Dysphagia, pharyngoesophageal phase: Secondary | ICD-10-CM | POA: Diagnosis not present

## 2020-07-13 DIAGNOSIS — I10 Essential (primary) hypertension: Secondary | ICD-10-CM | POA: Diagnosis not present

## 2020-07-13 DIAGNOSIS — J189 Pneumonia, unspecified organism: Secondary | ICD-10-CM | POA: Diagnosis not present

## 2020-07-13 DIAGNOSIS — E876 Hypokalemia: Secondary | ICD-10-CM | POA: Diagnosis not present

## 2020-07-13 DIAGNOSIS — J9601 Acute respiratory failure with hypoxia: Secondary | ICD-10-CM | POA: Diagnosis not present

## 2020-07-17 DIAGNOSIS — E785 Hyperlipidemia, unspecified: Secondary | ICD-10-CM | POA: Diagnosis not present

## 2020-07-17 DIAGNOSIS — I10 Essential (primary) hypertension: Secondary | ICD-10-CM | POA: Diagnosis not present

## 2020-07-17 DIAGNOSIS — E876 Hypokalemia: Secondary | ICD-10-CM | POA: Diagnosis not present

## 2020-07-17 DIAGNOSIS — J189 Pneumonia, unspecified organism: Secondary | ICD-10-CM | POA: Diagnosis not present

## 2020-07-17 DIAGNOSIS — J9601 Acute respiratory failure with hypoxia: Secondary | ICD-10-CM | POA: Diagnosis not present

## 2020-07-17 DIAGNOSIS — R1314 Dysphagia, pharyngoesophageal phase: Secondary | ICD-10-CM | POA: Diagnosis not present

## 2020-07-18 DIAGNOSIS — E785 Hyperlipidemia, unspecified: Secondary | ICD-10-CM | POA: Diagnosis not present

## 2020-07-18 DIAGNOSIS — J189 Pneumonia, unspecified organism: Secondary | ICD-10-CM | POA: Diagnosis not present

## 2020-07-18 DIAGNOSIS — I10 Essential (primary) hypertension: Secondary | ICD-10-CM | POA: Diagnosis not present

## 2020-07-18 DIAGNOSIS — R1314 Dysphagia, pharyngoesophageal phase: Secondary | ICD-10-CM | POA: Diagnosis not present

## 2020-07-18 DIAGNOSIS — E876 Hypokalemia: Secondary | ICD-10-CM | POA: Diagnosis not present

## 2020-07-18 DIAGNOSIS — J9601 Acute respiratory failure with hypoxia: Secondary | ICD-10-CM | POA: Diagnosis not present

## 2020-07-19 DIAGNOSIS — R1013 Epigastric pain: Secondary | ICD-10-CM | POA: Diagnosis not present

## 2020-07-19 DIAGNOSIS — R634 Abnormal weight loss: Secondary | ICD-10-CM | POA: Diagnosis not present

## 2020-07-19 DIAGNOSIS — N281 Cyst of kidney, acquired: Secondary | ICD-10-CM | POA: Diagnosis not present

## 2020-07-19 DIAGNOSIS — I2510 Atherosclerotic heart disease of native coronary artery without angina pectoris: Secondary | ICD-10-CM | POA: Diagnosis not present

## 2020-07-19 DIAGNOSIS — J439 Emphysema, unspecified: Secondary | ICD-10-CM | POA: Diagnosis not present

## 2020-07-19 DIAGNOSIS — Z9049 Acquired absence of other specified parts of digestive tract: Secondary | ICD-10-CM | POA: Diagnosis not present

## 2020-07-19 DIAGNOSIS — M419 Scoliosis, unspecified: Secondary | ICD-10-CM | POA: Diagnosis not present

## 2020-07-19 DIAGNOSIS — K5730 Diverticulosis of large intestine without perforation or abscess without bleeding: Secondary | ICD-10-CM | POA: Diagnosis not present

## 2020-07-19 DIAGNOSIS — J90 Pleural effusion, not elsewhere classified: Secondary | ICD-10-CM | POA: Diagnosis not present

## 2020-07-19 DIAGNOSIS — K5900 Constipation, unspecified: Secondary | ICD-10-CM | POA: Diagnosis not present

## 2020-07-19 DIAGNOSIS — Z95828 Presence of other vascular implants and grafts: Secondary | ICD-10-CM | POA: Diagnosis not present

## 2020-07-19 DIAGNOSIS — Z87891 Personal history of nicotine dependence: Secondary | ICD-10-CM | POA: Diagnosis not present

## 2020-07-19 DIAGNOSIS — K449 Diaphragmatic hernia without obstruction or gangrene: Secondary | ICD-10-CM | POA: Diagnosis not present

## 2020-07-20 DIAGNOSIS — E785 Hyperlipidemia, unspecified: Secondary | ICD-10-CM | POA: Diagnosis not present

## 2020-07-20 DIAGNOSIS — J189 Pneumonia, unspecified organism: Secondary | ICD-10-CM | POA: Diagnosis not present

## 2020-07-20 DIAGNOSIS — I10 Essential (primary) hypertension: Secondary | ICD-10-CM | POA: Diagnosis not present

## 2020-07-20 DIAGNOSIS — R1314 Dysphagia, pharyngoesophageal phase: Secondary | ICD-10-CM | POA: Diagnosis not present

## 2020-07-20 DIAGNOSIS — E876 Hypokalemia: Secondary | ICD-10-CM | POA: Diagnosis not present

## 2020-07-20 DIAGNOSIS — J9601 Acute respiratory failure with hypoxia: Secondary | ICD-10-CM | POA: Diagnosis not present

## 2020-07-24 DIAGNOSIS — E876 Hypokalemia: Secondary | ICD-10-CM | POA: Diagnosis not present

## 2020-07-24 DIAGNOSIS — I10 Essential (primary) hypertension: Secondary | ICD-10-CM | POA: Diagnosis not present

## 2020-07-24 DIAGNOSIS — J189 Pneumonia, unspecified organism: Secondary | ICD-10-CM | POA: Diagnosis not present

## 2020-07-24 DIAGNOSIS — R1314 Dysphagia, pharyngoesophageal phase: Secondary | ICD-10-CM | POA: Diagnosis not present

## 2020-07-24 DIAGNOSIS — J9601 Acute respiratory failure with hypoxia: Secondary | ICD-10-CM | POA: Diagnosis not present

## 2020-07-24 DIAGNOSIS — E785 Hyperlipidemia, unspecified: Secondary | ICD-10-CM | POA: Diagnosis not present

## 2020-07-25 DIAGNOSIS — E876 Hypokalemia: Secondary | ICD-10-CM | POA: Diagnosis not present

## 2020-07-25 DIAGNOSIS — I10 Essential (primary) hypertension: Secondary | ICD-10-CM | POA: Diagnosis not present

## 2020-07-25 DIAGNOSIS — J189 Pneumonia, unspecified organism: Secondary | ICD-10-CM | POA: Diagnosis not present

## 2020-07-25 DIAGNOSIS — E785 Hyperlipidemia, unspecified: Secondary | ICD-10-CM | POA: Diagnosis not present

## 2020-07-25 DIAGNOSIS — J9601 Acute respiratory failure with hypoxia: Secondary | ICD-10-CM | POA: Diagnosis not present

## 2020-07-25 DIAGNOSIS — R1314 Dysphagia, pharyngoesophageal phase: Secondary | ICD-10-CM | POA: Diagnosis not present

## 2020-07-26 DIAGNOSIS — Z87891 Personal history of nicotine dependence: Secondary | ICD-10-CM | POA: Diagnosis not present

## 2020-07-26 DIAGNOSIS — Z9181 History of falling: Secondary | ICD-10-CM | POA: Diagnosis not present

## 2020-07-26 DIAGNOSIS — Z8546 Personal history of malignant neoplasm of prostate: Secondary | ICD-10-CM | POA: Diagnosis not present

## 2020-07-26 DIAGNOSIS — I10 Essential (primary) hypertension: Secondary | ICD-10-CM | POA: Diagnosis not present

## 2020-07-26 DIAGNOSIS — Z85038 Personal history of other malignant neoplasm of large intestine: Secondary | ICD-10-CM | POA: Diagnosis not present

## 2020-07-26 DIAGNOSIS — Z85118 Personal history of other malignant neoplasm of bronchus and lung: Secondary | ICD-10-CM | POA: Diagnosis not present

## 2020-07-26 DIAGNOSIS — E785 Hyperlipidemia, unspecified: Secondary | ICD-10-CM | POA: Diagnosis not present

## 2020-07-26 DIAGNOSIS — J9601 Acute respiratory failure with hypoxia: Secondary | ICD-10-CM | POA: Diagnosis not present

## 2020-07-26 DIAGNOSIS — E876 Hypokalemia: Secondary | ICD-10-CM | POA: Diagnosis not present

## 2020-07-26 DIAGNOSIS — R1314 Dysphagia, pharyngoesophageal phase: Secondary | ICD-10-CM | POA: Diagnosis not present

## 2020-07-26 DIAGNOSIS — Z7902 Long term (current) use of antithrombotics/antiplatelets: Secondary | ICD-10-CM | POA: Diagnosis not present

## 2020-07-26 DIAGNOSIS — J189 Pneumonia, unspecified organism: Secondary | ICD-10-CM | POA: Diagnosis not present

## 2020-07-26 DIAGNOSIS — Z9981 Dependence on supplemental oxygen: Secondary | ICD-10-CM | POA: Diagnosis not present

## 2020-07-27 DIAGNOSIS — E876 Hypokalemia: Secondary | ICD-10-CM | POA: Diagnosis not present

## 2020-07-27 DIAGNOSIS — J9601 Acute respiratory failure with hypoxia: Secondary | ICD-10-CM | POA: Diagnosis not present

## 2020-07-27 DIAGNOSIS — J189 Pneumonia, unspecified organism: Secondary | ICD-10-CM | POA: Diagnosis not present

## 2020-07-27 DIAGNOSIS — I10 Essential (primary) hypertension: Secondary | ICD-10-CM | POA: Diagnosis not present

## 2020-07-27 DIAGNOSIS — R1314 Dysphagia, pharyngoesophageal phase: Secondary | ICD-10-CM | POA: Diagnosis not present

## 2020-07-27 DIAGNOSIS — E785 Hyperlipidemia, unspecified: Secondary | ICD-10-CM | POA: Diagnosis not present

## 2020-07-31 DIAGNOSIS — J189 Pneumonia, unspecified organism: Secondary | ICD-10-CM | POA: Diagnosis not present

## 2020-07-31 DIAGNOSIS — J9601 Acute respiratory failure with hypoxia: Secondary | ICD-10-CM | POA: Diagnosis not present

## 2020-07-31 DIAGNOSIS — E785 Hyperlipidemia, unspecified: Secondary | ICD-10-CM | POA: Diagnosis not present

## 2020-07-31 DIAGNOSIS — I10 Essential (primary) hypertension: Secondary | ICD-10-CM | POA: Diagnosis not present

## 2020-07-31 DIAGNOSIS — E876 Hypokalemia: Secondary | ICD-10-CM | POA: Diagnosis not present

## 2020-07-31 DIAGNOSIS — R1314 Dysphagia, pharyngoesophageal phase: Secondary | ICD-10-CM | POA: Diagnosis not present

## 2020-08-01 DIAGNOSIS — R1314 Dysphagia, pharyngoesophageal phase: Secondary | ICD-10-CM | POA: Diagnosis not present

## 2020-08-01 DIAGNOSIS — E876 Hypokalemia: Secondary | ICD-10-CM | POA: Diagnosis not present

## 2020-08-01 DIAGNOSIS — J9601 Acute respiratory failure with hypoxia: Secondary | ICD-10-CM | POA: Diagnosis not present

## 2020-08-01 DIAGNOSIS — I10 Essential (primary) hypertension: Secondary | ICD-10-CM | POA: Diagnosis not present

## 2020-08-01 DIAGNOSIS — J189 Pneumonia, unspecified organism: Secondary | ICD-10-CM | POA: Diagnosis not present

## 2020-08-01 DIAGNOSIS — E785 Hyperlipidemia, unspecified: Secondary | ICD-10-CM | POA: Diagnosis not present

## 2020-08-03 DIAGNOSIS — E785 Hyperlipidemia, unspecified: Secondary | ICD-10-CM | POA: Diagnosis not present

## 2020-08-03 DIAGNOSIS — I10 Essential (primary) hypertension: Secondary | ICD-10-CM | POA: Diagnosis not present

## 2020-08-03 DIAGNOSIS — E876 Hypokalemia: Secondary | ICD-10-CM | POA: Diagnosis not present

## 2020-08-03 DIAGNOSIS — J9601 Acute respiratory failure with hypoxia: Secondary | ICD-10-CM | POA: Diagnosis not present

## 2020-08-03 DIAGNOSIS — J189 Pneumonia, unspecified organism: Secondary | ICD-10-CM | POA: Diagnosis not present

## 2020-08-03 DIAGNOSIS — R1314 Dysphagia, pharyngoesophageal phase: Secondary | ICD-10-CM | POA: Diagnosis not present

## 2021-01-10 DIAGNOSIS — N183 Chronic kidney disease, stage 3 unspecified: Secondary | ICD-10-CM | POA: Diagnosis not present

## 2021-01-10 DIAGNOSIS — I739 Peripheral vascular disease, unspecified: Secondary | ICD-10-CM | POA: Diagnosis not present

## 2021-01-10 DIAGNOSIS — Z6825 Body mass index (BMI) 25.0-25.9, adult: Secondary | ICD-10-CM | POA: Diagnosis not present

## 2021-01-10 DIAGNOSIS — K219 Gastro-esophageal reflux disease without esophagitis: Secondary | ICD-10-CM | POA: Diagnosis not present

## 2021-01-10 DIAGNOSIS — E039 Hypothyroidism, unspecified: Secondary | ICD-10-CM | POA: Diagnosis not present

## 2021-01-10 DIAGNOSIS — I10 Essential (primary) hypertension: Secondary | ICD-10-CM | POA: Diagnosis not present

## 2021-01-10 DIAGNOSIS — K222 Esophageal obstruction: Secondary | ICD-10-CM | POA: Diagnosis not present

## 2021-01-10 DIAGNOSIS — I129 Hypertensive chronic kidney disease with stage 1 through stage 4 chronic kidney disease, or unspecified chronic kidney disease: Secondary | ICD-10-CM | POA: Diagnosis not present

## 2021-01-10 DIAGNOSIS — Z8546 Personal history of malignant neoplasm of prostate: Secondary | ICD-10-CM | POA: Diagnosis not present

## 2021-01-10 DIAGNOSIS — Z0000 Encounter for general adult medical examination without abnormal findings: Secondary | ICD-10-CM | POA: Diagnosis not present

## 2021-04-05 DIAGNOSIS — R946 Abnormal results of thyroid function studies: Secondary | ICD-10-CM | POA: Diagnosis not present

## 2021-04-05 DIAGNOSIS — R0602 Shortness of breath: Secondary | ICD-10-CM | POA: Diagnosis not present

## 2021-04-05 DIAGNOSIS — E0789 Other specified disorders of thyroid: Secondary | ICD-10-CM | POA: Diagnosis not present

## 2021-04-05 DIAGNOSIS — R531 Weakness: Secondary | ICD-10-CM | POA: Diagnosis not present

## 2021-04-05 DIAGNOSIS — J111 Influenza due to unidentified influenza virus with other respiratory manifestations: Secondary | ICD-10-CM | POA: Diagnosis not present

## 2021-04-05 DIAGNOSIS — J101 Influenza due to other identified influenza virus with other respiratory manifestations: Secondary | ICD-10-CM | POA: Diagnosis not present

## 2021-04-05 DIAGNOSIS — R918 Other nonspecific abnormal finding of lung field: Secondary | ICD-10-CM | POA: Diagnosis not present

## 2021-04-10 DIAGNOSIS — I10 Essential (primary) hypertension: Secondary | ICD-10-CM | POA: Diagnosis not present

## 2021-04-10 DIAGNOSIS — K219 Gastro-esophageal reflux disease without esophagitis: Secondary | ICD-10-CM | POA: Diagnosis not present

## 2021-04-10 DIAGNOSIS — Z8546 Personal history of malignant neoplasm of prostate: Secondary | ICD-10-CM | POA: Diagnosis not present

## 2021-04-10 DIAGNOSIS — R54 Age-related physical debility: Secondary | ICD-10-CM | POA: Diagnosis not present

## 2021-04-10 DIAGNOSIS — E039 Hypothyroidism, unspecified: Secondary | ICD-10-CM | POA: Diagnosis not present

## 2021-04-10 DIAGNOSIS — Z6824 Body mass index (BMI) 24.0-24.9, adult: Secondary | ICD-10-CM | POA: Diagnosis not present

## 2021-04-10 DIAGNOSIS — I129 Hypertensive chronic kidney disease with stage 1 through stage 4 chronic kidney disease, or unspecified chronic kidney disease: Secondary | ICD-10-CM | POA: Diagnosis not present

## 2021-04-10 DIAGNOSIS — I739 Peripheral vascular disease, unspecified: Secondary | ICD-10-CM | POA: Diagnosis not present

## 2021-04-14 DIAGNOSIS — I10 Essential (primary) hypertension: Secondary | ICD-10-CM | POA: Diagnosis not present

## 2021-04-14 DIAGNOSIS — R531 Weakness: Secondary | ICD-10-CM | POA: Diagnosis not present

## 2021-04-14 DIAGNOSIS — R001 Bradycardia, unspecified: Secondary | ICD-10-CM | POA: Diagnosis not present

## 2021-04-14 DIAGNOSIS — G8911 Acute pain due to trauma: Secondary | ICD-10-CM | POA: Diagnosis not present

## 2021-04-14 DIAGNOSIS — R296 Repeated falls: Secondary | ICD-10-CM | POA: Diagnosis not present

## 2021-04-14 DIAGNOSIS — S199XXA Unspecified injury of neck, initial encounter: Secondary | ICD-10-CM | POA: Diagnosis not present

## 2021-04-14 DIAGNOSIS — R29810 Facial weakness: Secondary | ICD-10-CM | POA: Diagnosis not present

## 2021-04-14 DIAGNOSIS — S0003XA Contusion of scalp, initial encounter: Secondary | ICD-10-CM | POA: Diagnosis not present

## 2021-04-16 DIAGNOSIS — R41841 Cognitive communication deficit: Secondary | ICD-10-CM | POA: Diagnosis not present

## 2021-04-16 DIAGNOSIS — N401 Enlarged prostate with lower urinary tract symptoms: Secondary | ICD-10-CM | POA: Diagnosis not present

## 2021-04-16 DIAGNOSIS — I10 Essential (primary) hypertension: Secondary | ICD-10-CM | POA: Diagnosis not present

## 2021-04-16 DIAGNOSIS — I129 Hypertensive chronic kidney disease with stage 1 through stage 4 chronic kidney disease, or unspecified chronic kidney disease: Secondary | ICD-10-CM | POA: Diagnosis not present

## 2021-04-16 DIAGNOSIS — K2100 Gastro-esophageal reflux disease with esophagitis, without bleeding: Secondary | ICD-10-CM | POA: Diagnosis not present

## 2021-04-16 DIAGNOSIS — F0150 Vascular dementia without behavioral disturbance: Secondary | ICD-10-CM | POA: Diagnosis not present

## 2021-04-16 DIAGNOSIS — K5901 Slow transit constipation: Secondary | ICD-10-CM | POA: Diagnosis not present

## 2021-04-16 DIAGNOSIS — K219 Gastro-esophageal reflux disease without esophagitis: Secondary | ICD-10-CM | POA: Diagnosis not present

## 2021-04-16 DIAGNOSIS — N189 Chronic kidney disease, unspecified: Secondary | ICD-10-CM | POA: Diagnosis not present

## 2021-04-16 DIAGNOSIS — I739 Peripheral vascular disease, unspecified: Secondary | ICD-10-CM | POA: Diagnosis not present

## 2021-04-16 DIAGNOSIS — E782 Mixed hyperlipidemia: Secondary | ICD-10-CM | POA: Diagnosis not present

## 2021-04-16 DIAGNOSIS — M6259 Muscle wasting and atrophy, not elsewhere classified, multiple sites: Secondary | ICD-10-CM | POA: Diagnosis not present

## 2021-04-16 DIAGNOSIS — E785 Hyperlipidemia, unspecified: Secondary | ICD-10-CM | POA: Diagnosis not present

## 2021-04-16 DIAGNOSIS — M6281 Muscle weakness (generalized): Secondary | ICD-10-CM | POA: Diagnosis not present

## 2021-04-16 DIAGNOSIS — R52 Pain, unspecified: Secondary | ICD-10-CM | POA: Diagnosis not present

## 2021-04-16 DIAGNOSIS — Z7409 Other reduced mobility: Secondary | ICD-10-CM | POA: Diagnosis not present

## 2021-04-16 DIAGNOSIS — Z8546 Personal history of malignant neoplasm of prostate: Secondary | ICD-10-CM | POA: Diagnosis not present

## 2021-04-16 DIAGNOSIS — R2681 Unsteadiness on feet: Secondary | ICD-10-CM | POA: Diagnosis not present

## 2021-04-16 DIAGNOSIS — E039 Hypothyroidism, unspecified: Secondary | ICD-10-CM | POA: Diagnosis not present

## 2021-04-16 DIAGNOSIS — R262 Difficulty in walking, not elsewhere classified: Secondary | ICD-10-CM | POA: Diagnosis not present

## 2021-04-17 DIAGNOSIS — R52 Pain, unspecified: Secondary | ICD-10-CM | POA: Diagnosis not present

## 2021-04-17 DIAGNOSIS — K219 Gastro-esophageal reflux disease without esophagitis: Secondary | ICD-10-CM | POA: Diagnosis not present

## 2021-04-17 DIAGNOSIS — M6259 Muscle wasting and atrophy, not elsewhere classified, multiple sites: Secondary | ICD-10-CM | POA: Diagnosis not present

## 2021-04-17 DIAGNOSIS — K5901 Slow transit constipation: Secondary | ICD-10-CM | POA: Diagnosis not present

## 2021-04-17 DIAGNOSIS — E782 Mixed hyperlipidemia: Secondary | ICD-10-CM | POA: Diagnosis not present

## 2021-04-17 DIAGNOSIS — N401 Enlarged prostate with lower urinary tract symptoms: Secondary | ICD-10-CM | POA: Diagnosis not present

## 2021-04-17 DIAGNOSIS — M6281 Muscle weakness (generalized): Secondary | ICD-10-CM | POA: Diagnosis not present

## 2021-04-17 DIAGNOSIS — E039 Hypothyroidism, unspecified: Secondary | ICD-10-CM | POA: Diagnosis not present

## 2021-04-17 DIAGNOSIS — K2100 Gastro-esophageal reflux disease with esophagitis, without bleeding: Secondary | ICD-10-CM | POA: Diagnosis not present

## 2021-04-17 DIAGNOSIS — I10 Essential (primary) hypertension: Secondary | ICD-10-CM | POA: Diagnosis not present

## 2021-04-17 DIAGNOSIS — F0150 Vascular dementia without behavioral disturbance: Secondary | ICD-10-CM | POA: Diagnosis not present

## 2021-04-17 DIAGNOSIS — E785 Hyperlipidemia, unspecified: Secondary | ICD-10-CM | POA: Diagnosis not present

## 2021-04-17 DIAGNOSIS — I739 Peripheral vascular disease, unspecified: Secondary | ICD-10-CM | POA: Diagnosis not present

## 2021-04-17 DIAGNOSIS — R2681 Unsteadiness on feet: Secondary | ICD-10-CM | POA: Diagnosis not present

## 2021-04-19 DIAGNOSIS — I10 Essential (primary) hypertension: Secondary | ICD-10-CM | POA: Diagnosis not present

## 2021-04-19 DIAGNOSIS — E785 Hyperlipidemia, unspecified: Secondary | ICD-10-CM | POA: Diagnosis not present

## 2021-04-19 DIAGNOSIS — R2681 Unsteadiness on feet: Secondary | ICD-10-CM | POA: Diagnosis not present

## 2021-04-19 DIAGNOSIS — K5901 Slow transit constipation: Secondary | ICD-10-CM | POA: Diagnosis not present

## 2021-04-19 DIAGNOSIS — F0150 Vascular dementia without behavioral disturbance: Secondary | ICD-10-CM | POA: Diagnosis not present

## 2021-04-19 DIAGNOSIS — K2100 Gastro-esophageal reflux disease with esophagitis, without bleeding: Secondary | ICD-10-CM | POA: Diagnosis not present

## 2021-04-19 DIAGNOSIS — K219 Gastro-esophageal reflux disease without esophagitis: Secondary | ICD-10-CM | POA: Diagnosis not present

## 2021-04-19 DIAGNOSIS — I739 Peripheral vascular disease, unspecified: Secondary | ICD-10-CM | POA: Diagnosis not present

## 2021-04-19 DIAGNOSIS — N401 Enlarged prostate with lower urinary tract symptoms: Secondary | ICD-10-CM | POA: Diagnosis not present

## 2021-04-19 DIAGNOSIS — R52 Pain, unspecified: Secondary | ICD-10-CM | POA: Diagnosis not present

## 2021-04-19 DIAGNOSIS — M6281 Muscle weakness (generalized): Secondary | ICD-10-CM | POA: Diagnosis not present

## 2021-04-19 DIAGNOSIS — E039 Hypothyroidism, unspecified: Secondary | ICD-10-CM | POA: Diagnosis not present

## 2021-04-19 DIAGNOSIS — M6259 Muscle wasting and atrophy, not elsewhere classified, multiple sites: Secondary | ICD-10-CM | POA: Diagnosis not present

## 2021-04-20 DIAGNOSIS — K219 Gastro-esophageal reflux disease without esophagitis: Secondary | ICD-10-CM | POA: Diagnosis not present

## 2021-04-20 DIAGNOSIS — M6281 Muscle weakness (generalized): Secondary | ICD-10-CM | POA: Diagnosis not present

## 2021-04-20 DIAGNOSIS — K5901 Slow transit constipation: Secondary | ICD-10-CM | POA: Diagnosis not present

## 2021-04-20 DIAGNOSIS — I10 Essential (primary) hypertension: Secondary | ICD-10-CM | POA: Diagnosis not present

## 2021-04-20 DIAGNOSIS — F0150 Vascular dementia without behavioral disturbance: Secondary | ICD-10-CM | POA: Diagnosis not present

## 2021-04-23 DIAGNOSIS — I739 Peripheral vascular disease, unspecified: Secondary | ICD-10-CM | POA: Diagnosis not present

## 2021-04-23 DIAGNOSIS — R52 Pain, unspecified: Secondary | ICD-10-CM | POA: Diagnosis not present

## 2021-04-23 DIAGNOSIS — M6281 Muscle weakness (generalized): Secondary | ICD-10-CM | POA: Diagnosis not present

## 2021-04-23 DIAGNOSIS — E785 Hyperlipidemia, unspecified: Secondary | ICD-10-CM | POA: Diagnosis not present

## 2021-04-23 DIAGNOSIS — R2681 Unsteadiness on feet: Secondary | ICD-10-CM | POA: Diagnosis not present

## 2021-04-23 DIAGNOSIS — N401 Enlarged prostate with lower urinary tract symptoms: Secondary | ICD-10-CM | POA: Diagnosis not present

## 2021-04-23 DIAGNOSIS — E039 Hypothyroidism, unspecified: Secondary | ICD-10-CM | POA: Diagnosis not present

## 2021-04-23 DIAGNOSIS — K2100 Gastro-esophageal reflux disease with esophagitis, without bleeding: Secondary | ICD-10-CM | POA: Diagnosis not present

## 2021-04-23 DIAGNOSIS — M6259 Muscle wasting and atrophy, not elsewhere classified, multiple sites: Secondary | ICD-10-CM | POA: Diagnosis not present

## 2021-04-23 DIAGNOSIS — I10 Essential (primary) hypertension: Secondary | ICD-10-CM | POA: Diagnosis not present

## 2021-04-24 DIAGNOSIS — F0150 Vascular dementia without behavioral disturbance: Secondary | ICD-10-CM | POA: Diagnosis not present

## 2021-04-24 DIAGNOSIS — K2100 Gastro-esophageal reflux disease with esophagitis, without bleeding: Secondary | ICD-10-CM | POA: Diagnosis not present

## 2021-04-24 DIAGNOSIS — K219 Gastro-esophageal reflux disease without esophagitis: Secondary | ICD-10-CM | POA: Diagnosis not present

## 2021-04-24 DIAGNOSIS — E782 Mixed hyperlipidemia: Secondary | ICD-10-CM | POA: Diagnosis not present

## 2021-04-24 DIAGNOSIS — I739 Peripheral vascular disease, unspecified: Secondary | ICD-10-CM | POA: Diagnosis not present

## 2021-04-24 DIAGNOSIS — M6281 Muscle weakness (generalized): Secondary | ICD-10-CM | POA: Diagnosis not present

## 2021-04-24 DIAGNOSIS — K5901 Slow transit constipation: Secondary | ICD-10-CM | POA: Diagnosis not present

## 2021-04-24 DIAGNOSIS — E785 Hyperlipidemia, unspecified: Secondary | ICD-10-CM | POA: Diagnosis not present

## 2021-04-24 DIAGNOSIS — M6259 Muscle wasting and atrophy, not elsewhere classified, multiple sites: Secondary | ICD-10-CM | POA: Diagnosis not present

## 2021-04-24 DIAGNOSIS — R2681 Unsteadiness on feet: Secondary | ICD-10-CM | POA: Diagnosis not present

## 2021-04-24 DIAGNOSIS — R52 Pain, unspecified: Secondary | ICD-10-CM | POA: Diagnosis not present

## 2021-04-24 DIAGNOSIS — E039 Hypothyroidism, unspecified: Secondary | ICD-10-CM | POA: Diagnosis not present

## 2021-04-24 DIAGNOSIS — I10 Essential (primary) hypertension: Secondary | ICD-10-CM | POA: Diagnosis not present

## 2021-04-24 DIAGNOSIS — N401 Enlarged prostate with lower urinary tract symptoms: Secondary | ICD-10-CM | POA: Diagnosis not present

## 2021-04-26 DIAGNOSIS — M6259 Muscle wasting and atrophy, not elsewhere classified, multiple sites: Secondary | ICD-10-CM | POA: Diagnosis not present

## 2021-04-26 DIAGNOSIS — N401 Enlarged prostate with lower urinary tract symptoms: Secondary | ICD-10-CM | POA: Diagnosis not present

## 2021-04-26 DIAGNOSIS — R52 Pain, unspecified: Secondary | ICD-10-CM | POA: Diagnosis not present

## 2021-04-26 DIAGNOSIS — K2100 Gastro-esophageal reflux disease with esophagitis, without bleeding: Secondary | ICD-10-CM | POA: Diagnosis not present

## 2021-04-26 DIAGNOSIS — I739 Peripheral vascular disease, unspecified: Secondary | ICD-10-CM | POA: Diagnosis not present

## 2021-04-26 DIAGNOSIS — E785 Hyperlipidemia, unspecified: Secondary | ICD-10-CM | POA: Diagnosis not present

## 2021-04-26 DIAGNOSIS — M6281 Muscle weakness (generalized): Secondary | ICD-10-CM | POA: Diagnosis not present

## 2021-04-26 DIAGNOSIS — I10 Essential (primary) hypertension: Secondary | ICD-10-CM | POA: Diagnosis not present

## 2021-04-26 DIAGNOSIS — E039 Hypothyroidism, unspecified: Secondary | ICD-10-CM | POA: Diagnosis not present

## 2021-04-26 DIAGNOSIS — R2681 Unsteadiness on feet: Secondary | ICD-10-CM | POA: Diagnosis not present

## 2021-04-27 DIAGNOSIS — F0150 Vascular dementia without behavioral disturbance: Secondary | ICD-10-CM | POA: Diagnosis not present

## 2021-04-27 DIAGNOSIS — K5901 Slow transit constipation: Secondary | ICD-10-CM | POA: Diagnosis not present

## 2021-04-27 DIAGNOSIS — K219 Gastro-esophageal reflux disease without esophagitis: Secondary | ICD-10-CM | POA: Diagnosis not present

## 2021-04-27 DIAGNOSIS — M6281 Muscle weakness (generalized): Secondary | ICD-10-CM | POA: Diagnosis not present

## 2021-04-27 DIAGNOSIS — I10 Essential (primary) hypertension: Secondary | ICD-10-CM | POA: Diagnosis not present

## 2023-08-28 DEATH — deceased
